# Patient Record
Sex: Male | Born: 1985
Health system: Southern US, Community
[De-identification: ages and names within clinical notes are randomized; demographics above are authoritative.]

## PROBLEM LIST (undated history)

## (undated) DIAGNOSIS — K219 Gastro-esophageal reflux disease without esophagitis: Secondary | ICD-10-CM

## (undated) DIAGNOSIS — R569 Unspecified convulsions: Secondary | ICD-10-CM

## (undated) DIAGNOSIS — K7460 Unspecified cirrhosis of liver: Secondary | ICD-10-CM

## (undated) DIAGNOSIS — F329 Major depressive disorder, single episode, unspecified: Secondary | ICD-10-CM

## (undated) DIAGNOSIS — F909 Attention-deficit hyperactivity disorder, unspecified type: Secondary | ICD-10-CM

## (undated) DIAGNOSIS — S060XAA Concussion with loss of consciousness status unknown, initial encounter: Secondary | ICD-10-CM

## (undated) DIAGNOSIS — F419 Anxiety disorder, unspecified: Secondary | ICD-10-CM

## (undated) DIAGNOSIS — F32A Depression, unspecified: Secondary | ICD-10-CM

## (undated) DIAGNOSIS — S060X9A Concussion with loss of consciousness of unspecified duration, initial encounter: Secondary | ICD-10-CM

## (undated) HISTORY — PX: ESOPHAGOGASTRODUODENOSCOPY: SHX1529

## (undated) HISTORY — DX: Gastro-esophageal reflux disease without esophagitis: K21.9

## (undated) HISTORY — DX: Attention-deficit hyperactivity disorder, unspecified type: F90.9

## (undated) HISTORY — DX: Unspecified cirrhosis of liver: K74.60

## (undated) HISTORY — PX: WISDOM TOOTH EXTRACTION: SHX21

---
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
## 2017-07-01 MED FILL — ONDANSETRON HCL 4 MG TABS: 4 | 10 days supply | Qty: 30 | Fill #0

## 2017-08-06 ENCOUNTER — Emergency Department (HOSPITAL_COMMUNITY): Payer: PRIVATE HEALTH INSURANCE

## 2017-08-06 ENCOUNTER — Encounter (HOSPITAL_COMMUNITY): Payer: Self-pay | Admitting: Emergency Medicine

## 2017-08-06 ENCOUNTER — Ambulatory Visit (HOSPITAL_COMMUNITY)
Admission: RE | Admit: 2017-08-06 | Discharge: 2017-08-06 | Disposition: A | Discharge status: Home / Self Care | Payer: PRIVATE HEALTH INSURANCE | Source: Ambulatory Visit | Attending: Family Medicine | Admitting: Family Medicine

## 2017-08-06 ENCOUNTER — Inpatient Hospital Stay (HOSPITAL_COMMUNITY)
Admission: EM | Admit: 2017-08-06 | Discharge: 2017-08-11 | DRG: 433 | Disposition: A | Discharge status: Home / Self Care | Payer: PRIVATE HEALTH INSURANCE | Attending: Family Medicine | Admitting: Family Medicine

## 2017-08-06 ENCOUNTER — Other Ambulatory Visit: Payer: Self-pay

## 2017-08-06 ENCOUNTER — Ambulatory Visit (INDEPENDENT_AMBULATORY_CARE_PROVIDER_SITE_OTHER): Payer: PRIVATE HEALTH INSURANCE | Admitting: Family Medicine

## 2017-08-06 ENCOUNTER — Telehealth: Payer: Self-pay | Admitting: Family Medicine

## 2017-08-06 ENCOUNTER — Encounter: Payer: Self-pay | Admitting: Family Medicine

## 2017-08-06 VITALS — BP 138/86 | HR 115 | Ht 68.0 in | Wt 211.5 lb

## 2017-08-06 DIAGNOSIS — K219 Gastro-esophageal reflux disease without esophagitis: Secondary | ICD-10-CM | POA: Diagnosis present

## 2017-08-06 DIAGNOSIS — R600 Localized edema: Secondary | ICD-10-CM

## 2017-08-06 DIAGNOSIS — D649 Anemia, unspecified: Secondary | ICD-10-CM | POA: Diagnosis present

## 2017-08-06 DIAGNOSIS — Z8042 Family history of malignant neoplasm of prostate: Secondary | ICD-10-CM

## 2017-08-06 DIAGNOSIS — R112 Nausea with vomiting, unspecified: Secondary | ICD-10-CM

## 2017-08-06 DIAGNOSIS — K760 Fatty (change of) liver, not elsewhere classified: Secondary | ICD-10-CM | POA: Diagnosis present

## 2017-08-06 DIAGNOSIS — K766 Portal hypertension: Secondary | ICD-10-CM | POA: Diagnosis present

## 2017-08-06 DIAGNOSIS — D684 Acquired coagulation factor deficiency: Secondary | ICD-10-CM | POA: Diagnosis present

## 2017-08-06 DIAGNOSIS — R0989 Other specified symptoms and signs involving the circulatory and respiratory systems: Secondary | ICD-10-CM

## 2017-08-06 DIAGNOSIS — E871 Hypo-osmolality and hyponatremia: Secondary | ICD-10-CM | POA: Diagnosis not present

## 2017-08-06 DIAGNOSIS — Z880 Allergy status to penicillin: Secondary | ICD-10-CM

## 2017-08-06 DIAGNOSIS — F410 Panic disorder [episodic paroxysmal anxiety] without agoraphobia: Secondary | ICD-10-CM | POA: Diagnosis present

## 2017-08-06 DIAGNOSIS — F1010 Alcohol abuse, uncomplicated: Secondary | ICD-10-CM | POA: Diagnosis not present

## 2017-08-06 DIAGNOSIS — K7460 Unspecified cirrhosis of liver: Secondary | ICD-10-CM | POA: Insufficient documentation

## 2017-08-06 DIAGNOSIS — K7040 Alcoholic hepatic failure without coma: Secondary | ICD-10-CM | POA: Diagnosis present

## 2017-08-06 DIAGNOSIS — F909 Attention-deficit hyperactivity disorder, unspecified type: Secondary | ICD-10-CM | POA: Insufficient documentation

## 2017-08-06 DIAGNOSIS — R0689 Other abnormalities of breathing: Secondary | ICD-10-CM

## 2017-08-06 DIAGNOSIS — E876 Hypokalemia: Secondary | ICD-10-CM

## 2017-08-06 DIAGNOSIS — R140 Abdominal distension (gaseous): Secondary | ICD-10-CM

## 2017-08-06 DIAGNOSIS — K7290 Hepatic failure, unspecified without coma: Secondary | ICD-10-CM

## 2017-08-06 DIAGNOSIS — F419 Anxiety disorder, unspecified: Secondary | ICD-10-CM

## 2017-08-06 DIAGNOSIS — R188 Other ascites: Secondary | ICD-10-CM

## 2017-08-06 DIAGNOSIS — Z803 Family history of malignant neoplasm of breast: Secondary | ICD-10-CM

## 2017-08-06 DIAGNOSIS — K7689 Other specified diseases of liver: Secondary | ICD-10-CM | POA: Diagnosis present

## 2017-08-06 DIAGNOSIS — K7011 Alcoholic hepatitis with ascites: Secondary | ICD-10-CM

## 2017-08-06 DIAGNOSIS — K7210 Chronic hepatic failure without coma: Secondary | ICD-10-CM

## 2017-08-06 DIAGNOSIS — D689 Coagulation defect, unspecified: Secondary | ICD-10-CM | POA: Diagnosis present

## 2017-08-06 DIAGNOSIS — Z833 Family history of diabetes mellitus: Secondary | ICD-10-CM

## 2017-08-06 DIAGNOSIS — Z8249 Family history of ischemic heart disease and other diseases of the circulatory system: Secondary | ICD-10-CM

## 2017-08-06 DIAGNOSIS — E861 Hypovolemia: Secondary | ICD-10-CM | POA: Diagnosis present

## 2017-08-06 DIAGNOSIS — Z8349 Family history of other endocrine, nutritional and metabolic diseases: Secondary | ICD-10-CM

## 2017-08-06 DIAGNOSIS — E8770 Fluid overload, unspecified: Secondary | ICD-10-CM | POA: Diagnosis present

## 2017-08-06 LAB — AMMONIA: AMMONIA: 78 umol/L — AB (ref 9–35)

## 2017-08-06 LAB — I-STAT CHEM 8, ED
BUN: 6 mg/dL (ref 6–20)
CREATININE: 1.2 mg/dL (ref 0.61–1.24)
Calcium, Ion: 0.97 mmol/L — ABNORMAL LOW (ref 1.15–1.40)
Chloride: 66 mmol/L — ABNORMAL LOW (ref 101–111)
GLUCOSE: 105 mg/dL — AB (ref 65–99)
HCT: 45 % (ref 39.0–52.0)
HEMOGLOBIN: 15.3 g/dL (ref 13.0–17.0)
POTASSIUM: 3.3 mmol/L — AB (ref 3.5–5.1)
Sodium: 109 mmol/L — CL (ref 135–145)
TCO2: 29 mmol/L (ref 22–32)

## 2017-08-06 LAB — ETHANOL

## 2017-08-06 MED ORDER — ONDANSETRON 8 MG PO TBDP
8.0000 mg | ORAL_TABLET | Freq: Three times a day (TID) | ORAL | 1 refills | Status: DC | PRN
Start: 2017-08-06 — End: 2018-09-02

## 2017-08-06 MED ORDER — SODIUM CHLORIDE 0.9 % IV SOLN
Freq: Once | INTRAVENOUS | Status: AC
  Administered 2017-08-06: 22:00:00 via INTRAVENOUS

## 2017-08-06 MED ORDER — ALPRAZOLAM 0.5 MG PO TABS: 0.2500 mg | ORAL_TABLET | ORAL | 0 refills | Status: DC | PRN

## 2017-08-06 MED ORDER — SODIUM CHLORIDE 0.9 % IV BOLUS: 500.0000 mL | Freq: Once | INTRAVENOUS | Status: DC

## 2017-08-06 NOTE — ED Triage Notes (Signed)
Patient presents due to being told by his doctor that his sodium was 113.  AxO at this time

## 2017-08-06 NOTE — Progress Notes (Addendum)
New patient office visit note:  Impression and Recommendations:    1. Non-intractable vomiting with nausea, unspecified vomiting type   2. Excessive drinking of alcohol   3. Bilateral lower extremity edema   4. Non-gaseous abdominal distention     1) Non intractable vomiting with nausea -Start zofran prn - concerns /w acute hepatitis sx/cirrhotic liver dx  2) excessive drinking of alcohol -Do not consume any alcohol, tylenol, or other hepatotoxic substances.  -Start xanax as patient with significant concerns for withdrawal symptoms; pt told to not mix ETOH with xanax and being a physician, is well aware of the risks.   - pt will use PRN only if s-e sx of ETOH w/d become bothersome ans if uncontrollably agitated etc  3) bilateral lower extremity edema - Concerns with hepatic portal hypertension/ venous congestion. -US venous lower bilateral extremity ordered today with venous doppler studies -Elevate your legs above your heart.   4) abdominal distention -Stat labs ordered see below. Will await results.  -Strong concerns for ascites and cirrhotic liver due to alcohol abuse -Ordered STAT abdominal US today. - N.p.o. for today.  Tomorrow start with clear liquid diet.  Multiple small intakes per day.   Drink more liquids, then eventually shift into a BRAT diet.  Reduce intake of fatty foods -Reduce salt intake.  -Further recommendations after CMP and other labs come back.  I have concerns for hyponatremia due to patient's excess alcohol intake.   - patient is significantly third spacing, and declines going to the hospital for IV fluids and fluid electrolyte management at this time.. -After our initial tests we will start you on prilosec 20 mg qd.  -Patient with a EGD in recent past 2-3 years ago but suspect due to psoriatic liver he will need another EGD and eventual workup.  Patient declined GI referral and stated he only wanted to be treated as an outpatient as I have  concerns about esophageal varices and other esophageal problems due to portal hypertension and possible cirrhotic liver   Education and routine counseling performed. Handouts provided.   Orders Placed This Encounter  Procedures  . US Abdomen Complete  . US Venous Img Lower Bilateral  . CBC with Differential/Platelet  . Comprehensive metabolic panel  . Hemoglobin A1c  . Lipid panel  . TSH  . VITAMIN D 25 Hydroxy (Vit-D Deficiency, Fractures)  . T4, free  . Gamma GT  . Direct LDL  . Lipase  . Amylase  . Bilirubin, neonatal (fractionated - tot/dir/indir)  . Ethanol  . INR/PT  . Hepatitis panel, acute    Meds ordered this encounter  Medications  . ALPRAZolam (XANAX) 0.5 MG tablet    Sig: Take 0.5 tablets (0.25 mg total) by mouth as needed (only for panic attacks).    Dispense:  30 tablet    Refill:  0  . ondansetron (ZOFRAN-ODT) 8 MG disintegrating tablet    Sig: Take 1 tablet (8 mg total) by mouth every 8 (eight) hours as needed for nausea.    Dispense:  30 tablet    Refill:  1    Gross side effects, risk and benefits, and alternatives of medications discussed with patient.  Patient is aware that all medications have potential side effects and we are unable to predict every side effect or drug-drug interaction that may occur.  Expresses verbal understanding and consents to current therapy plan and treatment regimen.  Return for Follow-up on stat labs as well as other studies  and soon as he can come in is th this coming Tuesday.-Patient states he needs to go back to work and declines my recommendations to stay home and elevate his legs and take care of himself over the next several days.  Soon as he can return is next Tuesday when he is off.  Appointment made for Tuesday   Please see AVS handed out to patient at the end of our visit for further patient instructions/ counseling done pertaining to today's office visit.    Note: This document was prepared using Dragon voice  recognition software and may include unintentional dictation errors.  This document serves as a record of services personally performed by [name], DO. It was created on her behalf by Thelma Barge, a trained medical scribe. The creation of this record is based on the scribe's personal observations and the provider's statements to them.   I have reviewed the above medical documentation for accuracy and completeness and I concur.  [name] 08/06/17 9:03 PM  ----------------------------------------------------------------------------------------------------------------------    Subjective:    Chief complaint:   Chief Complaint  Patient presents with  . Establish Care    HPI: Vincent [name] is a pleasant 32 y.o. male who presents to [facility] today to review their medical history with me and establish care.   I asked the patient to review their chronic problem list with me to ensure everything was updated and accurate.    All recent office visits with other providers, any medical records that patient brought in etc  - I reviewed today.     We asked pt to get [place] their medical records from [facility] providers/ specialists that they had seen within the past 3-5 years- if they are in private practice and/or do not work for [employer], [facility], [place], [facility] or [employer] owned practice.  Told them to call their specialists to clarify this if they are not sure.   Other providers He has not had a PCP in 1.5 years. He had an EDD in 2011 with results WNL.  He does not have a dermatologist.   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   SHx He drinks alcohol regularly, essentially every day. He mostly drinks beer (about 6 or more beers per day) and only occasionally hard liquor. This has been since college for him. He has picked this habit up more in the last few months due to stress from work. He does not drink in the mornings, but usually  only after work. He also has nausea if he does not drink.   He used to have a 6 pack and very fit/active before medical school.  PMHx He has concerns about gaining weight due to alcohol consumption. He is stressed out about work.   He has a h/o hiatal hernia    GERD- he takes prilosec PRN.  ADHD- he has been off ritalin since October due to not having a PCP.   He has occasional sleep difficulties due to stressors from his job.   FMHx Father prostate CA- age 27, basal, HTN, Mother breast CA- age 35s, basal and melanoma Maternal Uncle, hemorrhagic stroke (passed, age 25), DM2 Paternal Grandfather DM  Acutely He states he has been burping significantly recently since December. But since 3 weeks, he has had excessive bloating in his abdomen and legs. He initially had constipation. He has taken magnesium citrate which alleviated his symptoms, but returned after a few days. He does  not notice any difference in his symptoms after drinking. He states his legs were fine this morning, but in the afternoon after walking all day he has swelling in his legs. His swelling in his legs began about 1 week. He states the last few weeks his belly has been bloated. He has concerns about his swelling across his body.   He has nausea chronically that has worsened over the last few years. Consuming alcohol makes his symptoms better. He is concerned he has ascites and liver cirrhosis. He states last week he was off and drank a little bit more than usual.   He denies vomiting and agitation if he does not drink. He denies CP or palpitations. He denies excess salt consumption. He denies any hepatotoxic substances.    Wt Readings from Last 3 Encounters:  08/06/17 211 lb 8 oz (95.9 kg)   BP Readings from Last 3 Encounters:  08/06/17 138/86   Pulse Readings from Last 3 Encounters:  08/06/17 (!) 115   BMI Readings from Last 3 Encounters:  08/06/17 32.16 kg/m    Patient Care Team    Relationship  Specialty Notifications Start End  [name], DO PCP - General Family Medicine  08/06/17     Patient Active Problem List   Diagnosis Date Noted  . ADHD (attention deficit hyperactivity disorder) 08/06/2017     Past Medical History:  Diagnosis Date  . ADHD      Past Medical History:  Diagnosis Date  . ADHD         Family History  Problem Relation Age of Onset  . Cancer Mother        BREAST  . Heart disease Father   . Hyperlipidemia Father   . Cancer Father        PROSTATE  . Diabetes Maternal Uncle   . Diabetes Paternal Grandfather      Social History   Substance and Sexual Activity  Drug Use Never     Social History   Substance and Sexual Activity  Alcohol Use Yes  . Alcohol/week: 7.2 - 9.0 oz  . Types: 12 - 15 Standard drinks or equivalent per week     Social History   Tobacco Use  Smoking Status Never Smoker  Smokeless Tobacco Never Used     Current Meds  Medication Sig  . Melatonin-Pyridoxine (MELATIN PO) Take by mouth as needed.  Marland Kitchen omeprazole (PRILOSEC) 20 MG capsule Take 20 mg by mouth as needed.    Allergies: Amoxicillin   Review of Systems  Constitutional: Negative for chills, diaphoresis, fever, malaise/fatigue and weight loss.  HENT: Negative for congestion, sore throat and tinnitus.   Eyes: Negative for blurred vision, double vision and photophobia.  Respiratory: Negative for cough and wheezing.   Cardiovascular: Positive for leg swelling. Negative for chest pain, palpitations and orthopnea.  Gastrointestinal: Positive for constipation, heartburn and nausea. Negative for blood in stool and diarrhea.  Genitourinary: Negative for dysuria, flank pain, frequency, hematuria and urgency.  Musculoskeletal: Negative for joint pain and myalgias.  Skin: Negative for itching and rash.  Neurological: Negative for dizziness, tingling, tremors, sensory change, focal weakness, seizures, loss of consciousness, weakness and headaches.    Endo/Heme/Allergies: Negative for environmental allergies and polydipsia. Does not bruise/bleed easily.  Psychiatric/Behavioral: Positive for substance abuse. Negative for depression and memory loss. The patient is nervous/anxious and has insomnia.      Objective:   Blood pressure 138/86, pulse (!) 115, height 5\' 8"  (1.727 m), weight 211 lb  8 oz (95.9 kg). Body mass index is 32.16 kg/m. General: Well Developed, well nourished, and in no acute distress.  Neuro: Alert and oriented x3, extra-ocular muscles intact, sensation grossly intact.  HEENT:[place]/AT, PERRLA, neck supple, No carotid bruits; no JVD appreciated; sclera B/l slightly yellowish tinge Cardiac: Regular rhythm, slightly tacky, hyperdynamic. No m or rub Respiratory: Essentially clear to auscultation bilaterally.  No crackles or bibasilar rales.  Not using accessory muscles, speaking in full sentences.  Abdomen abd distention with fluid shift, tympany present; spider angiomas due to venous congestion present, umbilicus distended but not herniated Musculoskeletal: Ambulates w/o diff, FROM * 4 ext. No liver flap or hyperreflexia present Vasc: less 2 sec cap RF, warm and pink;  Severe pitting edema entire b/l l ext into proximal thighs, taught skin Psych:  No HI/SI, judgement and insight good, Somnolent-appearing.      No results found for this or any previous visit (from the past 2160 hour(s)).

## 2017-08-06 NOTE — Telephone Encounter (Signed)
Just got a call recently from [facility] telling me that patient's serum sodium was 113.    I tried calling patient on his cell phone which he gave me earlier today during office visit at [phone].    Called several times and was not able to reach him.   I will continue to reach out to patient.  Even called his wife on her cell phone as well as texted them both and asked her to have Vincent [name] call me as well as the patient to please call me ASAP.  Will cont to reach out.

## 2017-08-06 NOTE — ED Provider Notes (Addendum)
[facility] Provider Note   CSN: [id number] Arrival date & time: 08/06/17  2129     History   Chief Complaint Chief Complaint  Patient presents with  . Hyponatremia    HPI Hadley PenBrian Mcburney is a 32 y.o. male.  HPI  32 year old male sent from primary care office with reports that he has ascites and hyponatremia.  He complains of abdominal distention for 3 weeks.  He states that he took mag citrate and had a bowel movement but did not have significant relief of his abdominal distention.  He has had decreased appetite.  He has noted increased leg swelling that began today.  Ultrasound obtained by primary care significant for cirrhosis and ascites suggesting portal hypertension with gallbladder wall thickening with negative Murphy's sign suggesting that the gallbladder wall thickening is most likely related to chronic liver disease.  Primary care physician is Dr. Sharee Holsterpalski, she called and states that she was called and told that his sodium was 112.  She informed patient to come to the emergency department.  Patient's wife is here with the patient in the ED.  He reports drinking 4-6 beers per day.  He has a history of GERD and takes Prilosec daily for this.  Past Medical History:  Diagnosis Date  . ADHD     Patient Active Problem List   Diagnosis Date Noted  . ADHD (attention deficit hyperactivity disorder) 08/06/2017    Past Surgical History:  Procedure Laterality Date  . ESOPHAGOGASTRODUODENOSCOPY    . TONSILLECTOMY  1992  . WISDOM TOOTH EXTRACTION          Home Medications    Prior to Admission medications   Medication Sig Start Date End Date Taking? Authorizing Provider  ALPRAZolam Prudy Feeler(XANAX) 0.5 MG tablet Take 0.5 tablets (0.25 mg total) by mouth as needed (only for panic attacks). 08/06/17 02/02/18 Yes Opalski, Gavin Poundeborah, DO  Melatonin 5 MG TABS Take 5 mg by mouth at bedtime as needed (for sleep).   Yes [provider]  omeprazole  (PRILOSEC) 20 MG capsule Take 20 mg by mouth daily as needed (for acid reflux).    Yes [provider]  ondansetron (ZOFRAN-ODT) 8 MG disintegrating tablet Take 1 tablet (8 mg total) by mouth every 8 (eight) hours as needed for nausea. 08/06/17  Yes Opalski, Gavin Poundeborah, DO    Family History Family History  Problem Relation Age of Onset  . Cancer Mother        BREAST  . Heart disease Father   . Hyperlipidemia Father   . Cancer Father        PROSTATE  . Diabetes Maternal Uncle   . Diabetes Paternal Grandfather     Social History Social History   Tobacco Use  . Smoking status: Never Smoker  . Smokeless tobacco: Never Used  Substance Use Topics  . Alcohol use: Yes    Alcohol/week: 7.2 - 9.0 oz    Types: 12 - 15 Standard drinks or equivalent per week  . Drug use: Never     Allergies   Amoxicillin   Review of Systems Review of Systems  Constitutional: Positive for appetite change and fatigue. Negative for activity change, chills, diaphoresis and fever.  HENT: Negative.   Respiratory: Negative.   Cardiovascular: Negative.   Gastrointestinal: Positive for abdominal distention.  Endocrine: Negative.   Genitourinary: Negative.   Musculoskeletal: Negative.   Skin: Negative.   Neurological: Negative.   Hematological: Negative.   Psychiatric/Behavioral: Negative.   All other  systems reviewed and are negative.    Physical Exam Updated Vital Signs There were no vitals taken for this visit.  Physical Exam   ED Treatments / Results  Labs (all labs ordered are listed, but only abnormal results are displayed) Labs Reviewed  COMPREHENSIVE METABOLIC PANEL - Abnormal; Notable for the following components:      Result Value   Sodium 108 (*)    Potassium 3.4 (*)    Chloride 68 (*)    Glucose, Bld 103 (*)    Creatinine, Ser 1.31 (*)    Calcium 8.5 (*)    Total Protein 6.1 (*)    Albumin 2.9 (*)    AST 213 (*)    ALT 72 (*)    Alkaline Phosphatase 184 (*)     Total Bilirubin 5.3 (*)    All other components within normal limits  AMMONIA - Abnormal; Notable for the following components:   Ammonia 78 (*)    All other components within normal limits  I-STAT CHEM 8, ED - Abnormal; Notable for the following components:   Sodium 109 (*)    Potassium 3.3 (*)    Chloride 66 (*)    Glucose, Bld 105 (*)    Calcium, Ion 0.97 (*)    All other components within normal limits  ETHANOL  CBC WITH DIFFERENTIAL/PLATELET  ACETAMINOPHEN LEVEL  PROTIME-INR  HEPATITIS PANEL, ACUTE  URINALYSIS, ROUTINE W REFLEX MICROSCOPIC  SODIUM, URINE, RANDOM  OSMOLALITY, URINE  I-STAT CHEM 8, ED    EKG EKG Interpretation  Date/Time:  Tuesday August 06 2017 21:50:48 EDT Ventricular Rate:  117 PR Interval:    QRS Duration: 94 QT Interval:  360 QTC Calculation: 503 R Axis:   101 Text Interpretation:  Sinus tachycardia Right axis deviation Prolonged QT interval Confirmed by Margarita Grizzle [phone]) on 08/07/2017 12:00:06 AM   Radiology US Abdomen Complete  Result Date: 08/06/2017 CLINICAL DATA:  32 year old male with a history of ascites and vomiting EXAM: ABDOMEN ULTRASOUND COMPLETE COMPARISON:  None. FINDINGS: Gallbladder: Gallbladder wall thickening. Negative sonographic Murphy sign. Minimal degree of dependently layered sludge/microlithiasis without posterior shadowing. Common bile duct: Diameter: 7 mm-8 mm Liver: Nodular contour of the liver with diffusely heterogeneous echotexture. Focal cystic lesion within the right liver measures measures approximately 1.4 cm. Poor sonographic window, limiting evaluation of the main portal vein and right portal vein. The left portal vein is patent with hepatopetal flow. IVC: No abnormality visualized. Pancreas: Visualized portion unremarkable. Spleen: Greatest diameter of the spleen measures 7.8 cm Right Kidney: Length: 9.7 cm. Echogenicity within normal limits. No mass or hydronephrosis visualized. Left Kidney: Length: 10.9 cm.  Echogenicity within normal limits. No mass or hydronephrosis visualized. Abdominal aorta: No aneurysm visualized. Other findings: Ascites IMPRESSION: Cirrhosis and ascites, suggesting portal hypertension. The portal venous system is poorly visualized on the current study, and if there is concern for further evaluation, contrast-enhanced CT or MRI would be suggested. Gallbladder wall thickening most likely related to chronic liver disease. Small amount of biliary sludge/microlithiasis without sonographic evidence of acute cholecystitis. Electronically Signed   By: Gilmer Mor D.O.   On: 08/06/2017 19:01   Dg Chest Port 1 View  Result Date: 08/06/2017 CLINICAL DATA:  Ascites EXAM: PORTABLE CHEST 1 VIEW COMPARISON:  None. FINDINGS: Normal cardiac silhouette. Low lung volumes. No edema, infiltrate or pneumothorax. Band of atelectasis at the LEFT lung base. IMPRESSION: No infiltrate or edema. Electronically Signed   By: Genevive Bi M.D.   On: 08/06/2017 22:05  Procedures Procedures (including critical care time)  Medications Ordered in ED Medications  0.9 %  sodium chloride infusion ( Intravenous New Bag/Given 08/06/17 2210)     Initial Impression / Assessment and Plan / ED Course  I have reviewed the triage vital signs and the nursing notes.  Pertinent labs & imaging results that were available during my care of the patient were reviewed by me and considered in my medical decision making (see chart for details).    32 y.o. Male presents with nausea, vomiting, abdominal distension primary care [place] today with biliary hypertension.  Patient there with sodium 109, other electrolytes normal.  1- severe hyponatremia-chronicity uncertain-  No evidence of primary polydipsia or advance renal failure.  Will need urine to assess Siadh- urine pending.  patient awake and alert without apparent neuro changes, no report of seizure.  Discussed hypertonic saline, but given normal neuro and unknown  chronicity  IV ns infusing. EKG with qtc prolonged 2-Ascites and cirrhosis/liver failure- bili elevated, lfts elevated pending, but given outside [place] report c.w liver failure-  ?etiology etoh., drugs (denies acetaminophen), infection-   Discussed with Dr. Leone Payor, on call for GI, and will see in consult in am Discussed with Dr. Julian Reil and he will see for admission. CRITICAL CARE Performed by: Margarita Grizzle Total critical care time: 45 minutes Critical care time was exclusive of separately billable procedures and treating other patients. Critical care was necessary to treat or prevent imminent or life-threatening deterioration. Critical care was time spent personally by me on the following activities: development of treatment plan with patient and/or surrogate as well as nursing, discussions with consultants, evaluation of patient's response to treatment, examination of patient, obtaining history from patient or surrogate, ordering and performing treatments and interventions, ordering and review of laboratory studies, ordering and review of radiographic studies, pulse oximetry and re-evaluation of patient's condition.  Final Clinical Impressions(s) / ED Diagnoses   Final diagnoses:  Hyponatremia  Other ascites  Liver failure without hepatic coma, unspecified chronicity [facility])    ED Discharge Orders    None       Margarita Grizzle, MD 08/07/17 0001    Margarita Grizzle, MD 08/07/17 0020

## 2017-08-06 NOTE — ED Notes (Signed)
EDP at bedside  

## 2017-08-06 NOTE — Patient Instructions (Signed)
-  No alcohol, Tylenol or other hepatotoxic substances. -N.p.o. rest of today, then push clear fluids-multiple small amounts several times daily for a couple days advancing to brat diet as long as completely asymptomatic.   -Use Zofran as needed for nausea and Xanax as needed -Follow-up with me this coming Tuesday-1 week from today, sooner if concerns or problems. -Despite our desire and willingness to try to manage this as outpatient, if symptoms worsen, please proceed to ER -Stat ultrasound of abdomen and lower extremity today. -Stat blood work will be done

## 2017-08-07 ENCOUNTER — Encounter (HOSPITAL_COMMUNITY): Payer: PRIVATE HEALTH INSURANCE

## 2017-08-07 ENCOUNTER — Inpatient Hospital Stay (HOSPITAL_COMMUNITY): Payer: PRIVATE HEALTH INSURANCE

## 2017-08-07 ENCOUNTER — Encounter (HOSPITAL_COMMUNITY): Payer: Self-pay | Admitting: *Deleted

## 2017-08-07 DIAGNOSIS — K7460 Unspecified cirrhosis of liver: Secondary | ICD-10-CM | POA: Diagnosis not present

## 2017-08-07 DIAGNOSIS — K7011 Alcoholic hepatitis with ascites: Secondary | ICD-10-CM | POA: Diagnosis present

## 2017-08-07 DIAGNOSIS — E861 Hypovolemia: Secondary | ICD-10-CM | POA: Diagnosis present

## 2017-08-07 DIAGNOSIS — R188 Other ascites: Secondary | ICD-10-CM

## 2017-08-07 DIAGNOSIS — D689 Coagulation defect, unspecified: Secondary | ICD-10-CM | POA: Diagnosis not present

## 2017-08-07 DIAGNOSIS — K7200 Acute and subacute hepatic failure without coma: Secondary | ICD-10-CM | POA: Diagnosis not present

## 2017-08-07 DIAGNOSIS — R933 Abnormal findings on diagnostic imaging of other parts of digestive tract: Secondary | ICD-10-CM

## 2017-08-07 DIAGNOSIS — R0989 Other specified symptoms and signs involving the circulatory and respiratory systems: Secondary | ICD-10-CM | POA: Diagnosis not present

## 2017-08-07 DIAGNOSIS — F1010 Alcohol abuse, uncomplicated: Secondary | ICD-10-CM | POA: Diagnosis present

## 2017-08-07 DIAGNOSIS — D649 Anemia, unspecified: Secondary | ICD-10-CM | POA: Diagnosis present

## 2017-08-07 DIAGNOSIS — K760 Fatty (change of) liver, not elsewhere classified: Secondary | ICD-10-CM | POA: Diagnosis present

## 2017-08-07 DIAGNOSIS — Z880 Allergy status to penicillin: Secondary | ICD-10-CM | POA: Diagnosis not present

## 2017-08-07 DIAGNOSIS — E876 Hypokalemia: Secondary | ICD-10-CM | POA: Diagnosis present

## 2017-08-07 DIAGNOSIS — K7040 Alcoholic hepatic failure without coma: Secondary | ICD-10-CM | POA: Diagnosis present

## 2017-08-07 DIAGNOSIS — K219 Gastro-esophageal reflux disease without esophagitis: Secondary | ICD-10-CM | POA: Diagnosis present

## 2017-08-07 DIAGNOSIS — E8770 Fluid overload, unspecified: Secondary | ICD-10-CM | POA: Diagnosis present

## 2017-08-07 DIAGNOSIS — Z8349 Family history of other endocrine, nutritional and metabolic diseases: Secondary | ICD-10-CM | POA: Diagnosis not present

## 2017-08-07 DIAGNOSIS — R17 Unspecified jaundice: Secondary | ICD-10-CM | POA: Diagnosis not present

## 2017-08-07 DIAGNOSIS — Z8042 Family history of malignant neoplasm of prostate: Secondary | ICD-10-CM | POA: Diagnosis not present

## 2017-08-07 DIAGNOSIS — F909 Attention-deficit hyperactivity disorder, unspecified type: Secondary | ICD-10-CM | POA: Diagnosis present

## 2017-08-07 DIAGNOSIS — Z803 Family history of malignant neoplasm of breast: Secondary | ICD-10-CM | POA: Diagnosis not present

## 2017-08-07 DIAGNOSIS — Z833 Family history of diabetes mellitus: Secondary | ICD-10-CM | POA: Diagnosis not present

## 2017-08-07 DIAGNOSIS — Z8249 Family history of ischemic heart disease and other diseases of the circulatory system: Secondary | ICD-10-CM | POA: Diagnosis not present

## 2017-08-07 DIAGNOSIS — K7030 Alcoholic cirrhosis of liver without ascites: Secondary | ICD-10-CM | POA: Diagnosis not present

## 2017-08-07 DIAGNOSIS — K7290 Hepatic failure, unspecified without coma: Secondary | ICD-10-CM | POA: Diagnosis not present

## 2017-08-07 DIAGNOSIS — E871 Hypo-osmolality and hyponatremia: Secondary | ICD-10-CM | POA: Diagnosis present

## 2017-08-07 DIAGNOSIS — K766 Portal hypertension: Secondary | ICD-10-CM | POA: Diagnosis present

## 2017-08-07 DIAGNOSIS — D684 Acquired coagulation factor deficiency: Secondary | ICD-10-CM | POA: Diagnosis present

## 2017-08-07 DIAGNOSIS — F410 Panic disorder [episodic paroxysmal anxiety] without agoraphobia: Secondary | ICD-10-CM | POA: Diagnosis present

## 2017-08-07 DIAGNOSIS — F419 Anxiety disorder, unspecified: Secondary | ICD-10-CM | POA: Diagnosis present

## 2017-08-07 DIAGNOSIS — K7689 Other specified diseases of liver: Secondary | ICD-10-CM | POA: Diagnosis present

## 2017-08-07 LAB — COMPREHENSIVE METABOLIC PANEL
A/G RATIO: 1.1 — AB (ref 1.2–2.2)
ALK PHOS: 184 U/L — AB (ref 38–126)
ALT: 72 IU/L — AB (ref 0–44)
ALT: 72 U/L — AB (ref 17–63)
ALT: 60 U/L (ref 17–63)
ANION GAP: 13 (ref 5–15)
AST: 218 IU/L — AB (ref 0–40)
AST: 213 U/L — ABNORMAL HIGH (ref 15–41)
AST: 177 U/L — ABNORMAL HIGH (ref 15–41)
Albumin: 3.1 g/dL — ABNORMAL LOW (ref 3.5–5.5)
Albumin: 2.9 g/dL — ABNORMAL LOW (ref 3.5–5.0)
Albumin: 2.4 g/dL — ABNORMAL LOW (ref 3.5–5.0)
Alkaline Phosphatase: 202 IU/L — ABNORMAL HIGH (ref 39–117)
Alkaline Phosphatase: 154 U/L — ABNORMAL HIGH (ref 38–126)
Anion gap: 15 (ref 5–15)
BUN/Creatinine Ratio: 6 — ABNORMAL LOW (ref 9–20)
BUN: 7 mg/dL (ref 6–20)
BUN: 6 mg/dL (ref 6–20)
BUN: 7 mg/dL (ref 6–20)
Bilirubin Total: 4.4 mg/dL — ABNORMAL HIGH (ref 0.0–1.2)
CALCIUM: 8.5 mg/dL — AB (ref 8.9–10.3)
CHLORIDE: 69 mmol/L — AB (ref 101–111)
CO2: 27 mmol/L (ref 20–29)
CO2: 25 mmol/L (ref 22–32)
CO2: 26 mmol/L (ref 22–32)
CREATININE: 1.31 mg/dL — AB (ref 0.61–1.24)
Calcium: 9.2 mg/dL (ref 8.7–10.2)
Calcium: 7.9 mg/dL — ABNORMAL LOW (ref 8.9–10.3)
Chloride: 68 mmol/L — CL (ref 96–106)
Chloride: 68 mmol/L — ABNORMAL LOW (ref 101–111)
Creatinine, Ser: 1.18 mg/dL (ref 0.76–1.27)
Creatinine, Ser: 1.3 mg/dL — ABNORMAL HIGH (ref 0.61–1.24)
GFR calc Af Amer: 93 mL/min/{1.73_m2} (ref >59)
GFR calc Af Amer: >60 mL/min (ref >60)
GFR calc non Af Amer: 81 mL/min/{1.73_m2} (ref >59)
GFR calc non Af Amer: >60 mL/min (ref >60)
GLUCOSE: 119 mg/dL — AB (ref 65–99)
Globulin, Total: 2.7 g/dL (ref 1.5–4.5)
Glucose, Bld: 103 mg/dL — ABNORMAL HIGH (ref 65–99)
Glucose, Bld: 90 mg/dL (ref 65–99)
POTASSIUM: 5 mmol/L (ref 3.5–5.2)
Potassium: 3.4 mmol/L — ABNORMAL LOW (ref 3.5–5.1)
Potassium: 3.4 mmol/L — ABNORMAL LOW (ref 3.5–5.1)
SODIUM: 108 mmol/L — AB (ref 135–145)
Sodium: 113 mmol/L — CL (ref 134–144)
Sodium: 108 mmol/L — CL (ref 135–145)
Total Bilirubin: 5.3 mg/dL — ABNORMAL HIGH (ref 0.3–1.2)
Total Bilirubin: 4.8 mg/dL — ABNORMAL HIGH (ref 0.3–1.2)
Total Protein: 5.8 g/dL — ABNORMAL LOW (ref 6.0–8.5)
Total Protein: 6.1 g/dL — ABNORMAL LOW (ref 6.5–8.1)
Total Protein: 5.2 g/dL — ABNORMAL LOW (ref 6.5–8.1)

## 2017-08-07 LAB — CBC WITH DIFFERENTIAL/PLATELET
BASOS ABS: 0 10*3/uL (ref 0.0–0.1)
Basophils Absolute: 0 10*3/uL (ref 0.0–0.2)
Basophils Relative: 0 %
Basos: 0 %
EOS (ABSOLUTE): 0 10*3/uL (ref 0.0–0.4)
EOS PCT: 1 %
Eos: 0 %
Eosinophils Absolute: 0.1 10*3/uL (ref 0.0–0.7)
HCT: 38.7 % — ABNORMAL LOW (ref 39.0–52.0)
Hematocrit: 40.5 % (ref 37.5–51.0)
Hemoglobin: 14.9 g/dL (ref 13.0–17.7)
Hemoglobin: 14.7 g/dL (ref 13.0–17.0)
Immature Grans (Abs): 0 10*3/uL (ref 0.0–0.1)
Immature Granulocytes: 0 %
LYMPHS PCT: 15 %
Lymphocytes Absolute: 1.6 10*3/uL (ref 0.7–3.1)
Lymphs Abs: 2.4 10*3/uL (ref 0.7–4.0)
Lymphs: 10 %
MCH: 36.6 pg — ABNORMAL HIGH (ref 26.6–33.0)
MCH: 36.1 pg — ABNORMAL HIGH (ref 26.0–34.0)
MCHC: 36.8 g/dL — ABNORMAL HIGH (ref 31.5–35.7)
MCHC: 38 g/dL — ABNORMAL HIGH (ref 30.0–36.0)
MCV: 100 fL — ABNORMAL HIGH (ref 79–97)
MCV: 95.1 fL (ref 78.0–100.0)
Monocytes Absolute: 1.8 10*3/uL — ABNORMAL HIGH (ref 0.1–0.9)
Monocytes Absolute: 1.8 10*3/uL — ABNORMAL HIGH (ref 0.1–1.0)
Monocytes Relative: 11 %
Monocytes: 11 %
Neutro Abs: 12.1 10*3/uL — ABNORMAL HIGH (ref 1.7–7.7)
Neutrophils Absolute: 12.7 10*3/uL — ABNORMAL HIGH (ref 1.4–7.0)
Neutrophils Relative %: 73 %
Neutrophils: 79 %
PLATELETS: 363 10*3/uL (ref 150–400)
Platelets: 386 10*3/uL — ABNORMAL HIGH (ref 150–379)
RBC: 4.07 x10E6/uL — ABNORMAL LOW (ref 4.14–5.80)
RBC: 4.07 MIL/uL — AB (ref 4.22–5.81)
RDW: 13.9 % (ref 12.3–15.4)
RDW: 12.5 % (ref 11.5–15.5)
WBC: 16.2 10*3/uL — ABNORMAL HIGH (ref 3.4–10.8)
WBC: 16.4 10*3/uL — AB (ref 4.0–10.5)

## 2017-08-07 LAB — IRON AND TIBC
IRON: 91 ug/dL (ref 45–182)
Saturation Ratios: 88 % — ABNORMAL HIGH (ref 17.9–39.5)
TIBC: 104 ug/dL — AB (ref 250–450)
UIBC: 13 ug/dL

## 2017-08-07 LAB — BASIC METABOLIC PANEL
Anion gap: 8 (ref 5–15)
Anion gap: 10 (ref 5–15)
BUN: 5 mg/dL — AB (ref 6–20)
BUN: 5 mg/dL — AB (ref 6–20)
CHLORIDE: 79 mmol/L — AB (ref 101–111)
CHLORIDE: 82 mmol/L — AB (ref 101–111)
CO2: 26 mmol/L (ref 22–32)
CO2: 24 mmol/L (ref 22–32)
Calcium: 7.6 mg/dL — ABNORMAL LOW (ref 8.9–10.3)
Calcium: 7.6 mg/dL — ABNORMAL LOW (ref 8.9–10.3)
Creatinine, Ser: 1.21 mg/dL (ref 0.61–1.24)
Creatinine, Ser: 1.17 mg/dL (ref 0.61–1.24)
GFR calc Af Amer: >60 mL/min (ref >60)
GFR calc Af Amer: >60 mL/min (ref >60)
GFR calc non Af Amer: >60 mL/min (ref >60)
GFR calc non Af Amer: >60 mL/min (ref >60)
GLUCOSE: 104 mg/dL — AB (ref 65–99)
Glucose, Bld: 111 mg/dL — ABNORMAL HIGH (ref 65–99)
POTASSIUM: 3.1 mmol/L — AB (ref 3.5–5.1)
POTASSIUM: 2.8 mmol/L — AB (ref 3.5–5.1)
SODIUM: 113 mmol/L — AB (ref 135–145)
Sodium: 116 mmol/L — CL (ref 135–145)

## 2017-08-07 LAB — T4, FREE: FREE T4: 1.79 ng/dL — AB (ref 0.82–1.77)

## 2017-08-07 LAB — LIPID PANEL
CHOLESTEROL TOTAL: 195 mg/dL (ref 100–199)
Chol/HDL Ratio: 4.2 ratio (ref 0.0–5.0)
HDL: 46 mg/dL (ref >39)
LDL Calculated: 132 mg/dL — ABNORMAL HIGH (ref 0–99)
Triglycerides: 86 mg/dL (ref 0–149)
VLDL CHOLESTEROL CAL: 17 mg/dL (ref 5–40)

## 2017-08-07 LAB — SODIUM
Sodium: 111 mmol/L — CL (ref 135–145)
Sodium: 112 mmol/L — CL (ref 135–145)
Sodium: 113 mmol/L — CL (ref 135–145)

## 2017-08-07 LAB — HEPATITIS PANEL, ACUTE
HEP A IGM: NEGATIVE
HEP B S AG: NEGATIVE
Hep B C IgM: NEGATIVE
Hep C Virus Ab: <0.1 s/co ratio (ref 0.0–0.9)

## 2017-08-07 LAB — URINALYSIS, ROUTINE W REFLEX MICROSCOPIC
Glucose, UA: NEGATIVE mg/dL
HGB URINE DIPSTICK: NEGATIVE
Ketones, ur: NEGATIVE mg/dL
Leukocytes, UA: NEGATIVE
Nitrite: NEGATIVE
PROTEIN: NEGATIVE mg/dL
Specific Gravity, Urine: 1.012 (ref 1.005–1.030)
pH: 6 (ref 5.0–8.0)

## 2017-08-07 LAB — ETHANOL: Ethanol: NEGATIVE %

## 2017-08-07 LAB — OSMOLALITY, URINE: Osmolality, Ur: 287 mOsm/kg — ABNORMAL LOW (ref 300–900)

## 2017-08-07 LAB — HEMOGLOBIN A1C
ESTIMATED AVERAGE GLUCOSE: 77 mg/dL
Hgb A1c MFr Bld: 4.3 % — ABNORMAL LOW (ref 4.8–5.6)

## 2017-08-07 LAB — SODIUM, URINE, RANDOM

## 2017-08-07 LAB — TSH: TSH: 4.94 u[IU]/mL — ABNORMAL HIGH (ref 0.450–4.500)

## 2017-08-07 LAB — BILIRUBIN, FRACTIONATED(TOT/DIR/INDIR)
BILIRUBIN INDIRECT: 1.84 mg/dL — AB (ref 0.10–0.80)
Bilirubin, Direct: 2.56 mg/dL — ABNORMAL HIGH (ref 0.00–0.40)

## 2017-08-07 LAB — AMYLASE: Amylase: 58 U/L (ref 31–124)

## 2017-08-07 LAB — LDL CHOLESTEROL, DIRECT: LDL DIRECT: 149 mg/dL — AB (ref 0–99)

## 2017-08-07 LAB — PROTIME-INR
INR: 1.7 — ABNORMAL HIGH (ref 0.8–1.2)
INR: 1.54
PROTHROMBIN TIME: 17 s — AB (ref 9.1–12.0)
Prothrombin Time: 18.3 seconds — ABNORMAL HIGH (ref 11.4–15.2)

## 2017-08-07 LAB — ACETAMINOPHEN LEVEL

## 2017-08-07 LAB — VITAMIN D 25 HYDROXY (VIT D DEFICIENCY, FRACTURES): VIT D 25 HYDROXY: 10.3 ng/mL — AB (ref 30.0–100.0)

## 2017-08-07 LAB — FERRITIN: FERRITIN: 1483 ng/mL — AB (ref 24–336)

## 2017-08-07 LAB — LIPASE: LIPASE: 86 U/L — AB (ref 13–78)

## 2017-08-07 LAB — GAMMA GT: GGT: 213 IU/L — ABNORMAL HIGH (ref 0–65)

## 2017-08-07 MED ORDER — VITAMIN B-1 100 MG PO TABS
100.0000 mg | ORAL_TABLET | Freq: Every day | ORAL | Status: DC
  Administered 2017-08-07 – 2017-08-11 (×5): 100 mg via ORAL
  Filled 2017-08-07 (×5): qty 1

## 2017-08-07 MED ORDER — SODIUM CHLORIDE 0.9 % IV BOLUS
500.0000 mL | Freq: Once | INTRAVENOUS | Status: AC
  Administered 2017-08-07: 500 mL via INTRAVENOUS

## 2017-08-07 MED ORDER — LORAZEPAM 1 MG PO TABS
1.0000 mg | ORAL_TABLET | Freq: Four times a day (QID) | ORAL | Status: DC | PRN
  Administered 2017-08-07 – 2017-08-09 (×3): 1 mg via ORAL
  Filled 2017-08-07 (×5): qty 1

## 2017-08-07 MED ORDER — ONDANSETRON HCL 4 MG PO TABS
4.0000 mg | ORAL_TABLET | Freq: Four times a day (QID) | ORAL | Status: DC | PRN
  Administered 2017-08-09: 4 mg via ORAL
  Filled 2017-08-07: qty 1

## 2017-08-07 MED ORDER — ADULT MULTIVITAMIN W/MINERALS CH
1.0000 | ORAL_TABLET | Freq: Every day | ORAL | Status: DC
  Administered 2017-08-07 – 2017-08-11 (×5): 1 via ORAL
  Filled 2017-08-07 (×5): qty 1

## 2017-08-07 MED ORDER — ACETAMINOPHEN 650 MG RE SUPP: 650.0000 mg | Freq: Four times a day (QID) | RECTAL | Status: DC | PRN

## 2017-08-07 MED ORDER — FOLIC ACID 1 MG PO TABS
1.0000 mg | ORAL_TABLET | Freq: Every day | ORAL | Status: DC
  Administered 2017-08-07 – 2017-08-11 (×5): 1 mg via ORAL
  Filled 2017-08-07 (×5): qty 1

## 2017-08-07 MED ORDER — ACETAMINOPHEN 325 MG PO TABS: 650.0000 mg | ORAL_TABLET | Freq: Four times a day (QID) | ORAL | Status: DC | PRN

## 2017-08-07 MED ORDER — LORAZEPAM 2 MG/ML IJ SOLN
1.0000 mg | Freq: Four times a day (QID) | INTRAMUSCULAR | Status: DC | PRN
  Administered 2017-08-08 – 2017-08-10 (×4): 1 mg via INTRAVENOUS
  Filled 2017-08-07 (×4): qty 1

## 2017-08-07 MED ORDER — SODIUM CHLORIDE 3 % IV SOLN
INTRAVENOUS | Status: DC
  Administered 2017-08-07: 30 mL/h via INTRAVENOUS
  Filled 2017-08-07 (×2): qty 500

## 2017-08-07 MED ORDER — ONDANSETRON HCL 4 MG/2ML IJ SOLN: 4.0000 mg | Freq: Four times a day (QID) | INTRAMUSCULAR | Status: DC | PRN

## 2017-08-07 MED ORDER — POTASSIUM CHLORIDE CRYS ER 10 MEQ PO TBCR
30.0000 meq | EXTENDED_RELEASE_TABLET | ORAL | Status: AC
  Administered 2017-08-07 – 2017-08-08 (×2): 30 meq via ORAL
  Filled 2017-08-07 (×2): qty 1

## 2017-08-07 MED ORDER — ONDANSETRON 4 MG PO TBDP: 8.0000 mg | ORAL_TABLET | Freq: Three times a day (TID) | ORAL | Status: DC | PRN

## 2017-08-07 MED ORDER — LIDOCAINE HCL (PF) 2 % IJ SOLN
INTRAMUSCULAR | Status: AC
  Filled 2017-08-07: qty 10

## 2017-08-07 MED ORDER — ALPRAZOLAM 0.25 MG PO TABS
0.2500 mg | ORAL_TABLET | Freq: Every day | ORAL | Status: DC | PRN
  Administered 2017-08-07 (×2): 0.25 mg via ORAL
  Filled 2017-08-07 (×2): qty 1

## 2017-08-07 MED ORDER — PANTOPRAZOLE SODIUM 40 MG PO TBEC
40.0000 mg | DELAYED_RELEASE_TABLET | Freq: Every day | ORAL | Status: DC
  Administered 2017-08-07 – 2017-08-11 (×5): 40 mg via ORAL
  Filled 2017-08-07 (×5): qty 1

## 2017-08-07 NOTE — ED Notes (Signed)
Patient denies pain and is resting comfortably.  

## 2017-08-07 NOTE — H&P (Addendum)
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  He went to his PCPs office today with 3 week history of abdominal distention, increased leg swelling that began today.  Decreased appetite.  Went to PCPs office.  [place] of abdomen shows cirrhosis, ascites, and suggests portal hypertension.  Lab work at PCPs office showed sodium of 112 at which point PCP called patient and sent him in to the ED.   ED Course: Sodium 108, Cl 68.  BUN 6, creat 1.3.  WBC 16.4.  AST 213, ALT 72, T bili 5.3, ammonia 78, albumin 2.9.  Alk phos 184, INR 1.5.   Review of Systems: As per HPI otherwise 10 point review of systems negative.   Past Medical History:  Diagnosis Date  . ADHD     Past Surgical History:  Procedure Laterality Date  . ESOPHAGOGASTRODUODENOSCOPY    . TONSILLECTOMY  1992  . WISDOM TOOTH EXTRACTION       reports that he has never smoked. He has never used smokeless tobacco. He reports that he drinks about 7.2 - 9.0 oz of alcohol per week. He reports that he does not use drugs.  Allergies  Allergen Reactions  . Amoxicillin Rash    Family History  Problem Relation Age of Onset  . Cancer Mother        BREAST  . Heart disease Father   . Hyperlipidemia Father   . Cancer Father        PROSTATE  . Diabetes Maternal Uncle   . Diabetes Paternal Grandfather      Prior to Admission medications   Medication Sig Start Date End Date Taking? Authorizing Provider  ALPRAZolam Duanne Moron) 0.5 MG tablet Take 0.5 tablets (0.25 mg total) by mouth as needed (only for panic attacks). 08/06/17 02/02/18 Yes Opalski, Neoma Laming, DO  Melatonin 5 MG TABS Take 5 mg by mouth at bedtime as needed (for sleep).   Yes [provider]  omeprazole (PRILOSEC) 20 MG capsule Take 20 mg by mouth daily as needed (for acid reflux).    Yes [provider]  ondansetron (ZOFRAN-ODT) 8 MG disintegrating tablet Take 1 tablet (8 mg total) by mouth every 8 (eight) hours as needed for nausea. 08/06/17  Yes Mellody Dance, DO    Physical Exam: Vitals:   08/07/17 0015 08/07/17 0030 08/07/17 0045 08/07/17 0115  BP: 123/82 123/73 121/83 122/75  Pulse: (!) 112 (!) 107 (!) 114 (!) 108  Resp: 20 20 (!) 21 (!) 23  SpO2: 97% 98% 97% 98%    Constitutional: NAD, calm, comfortable Eyes: PERRL, lids and conjunctivae normal ENMT: Mucous membranes are moist. Posterior pharynx clear of any exudate or lesions.Normal dentition.  Neck: normal, supple, no masses, no thyromegaly Respiratory: clear to auscultation bilaterally, no wheezing, no crackles. Normal respiratory effort. No accessory muscle use.  Cardiovascular: Regular rate and rhythm, no murmurs / rubs / gallops. No extremity edema. 2+ pedal pulses. No carotid bruits.  Abdomen: Distended, no tenderness, no rebound Musculoskeletal: 1+ BLE edema. Skin: no rashes, lesions, ulcers. No induration Neurologic: CN 2-12 grossly intact. Sensation intact, DTR normal. Strength 5/5 in all 4.  Psychiatric: Normal judgment and insight. Alert and oriented x 3. Normal mood.    Labs  on Admission: I have personally reviewed following labs and imaging studies  CBC: Recent Labs  Lab 08/06/17 2208 08/06/17 2311  WBC 16.4*  --   NEUTROABS 12.1*  --   HGB 14.7 15.3  HCT 38.7* 45.0  MCV 95.1  --   PLT 363  --    Basic Metabolic Panel: Recent Labs  Lab 08/06/17 2208 08/06/17 2311  NA 108* 109*  K 3.4* 3.3*  CL 68* 66*  CO2 25  --   GLUCOSE 103* 105*  BUN 6 6  CREATININE 1.31* 1.20  CALCIUM 8.5*  --    GFR: Estimated Creatinine Clearance: 98.3 mL/min (by C-G formula based on SCr of 1.2 mg/dL). Liver Function Tests: Recent Labs  Lab 08/06/17 2208  AST 213*  ALT 72*   ALKPHOS 184*  BILITOT 5.3*  PROT 6.1*  ALBUMIN 2.9*   No results for input(s): LIPASE, AMYLASE in the last 168 hours. Recent Labs  Lab 08/06/17 2208  AMMONIA 78*   Coagulation Profile: Recent Labs  Lab 08/07/17 0047  INR 1.54   Cardiac Enzymes: No results for input(s): CKTOTAL, CKMB, CKMBINDEX, TROPONINI in the last 168 hours. BNP (last 3 results) No results for input(s): PROBNP in the last 8760 hours. HbA1C: No results for input(s): HGBA1C in the last 72 hours. CBG: No results for input(s): GLUCAP in the last 168 hours. Lipid Profile: No results for input(s): CHOL, HDL, LDLCALC, TRIG, CHOLHDL, LDLDIRECT in the last 72 hours. Thyroid Function Tests: No results for input(s): TSH, T4TOTAL, FREET4, T3FREE, THYROIDAB in the last 72 hours. Anemia Panel: No results for input(s): VITAMINB12, FOLATE, FERRITIN, TIBC, IRON, RETICCTPCT in the last 72 hours. Urine analysis: No results found for: COLORURINE, APPEARANCEUR, LABSPEC, PHURINE, GLUCOSEU, HGBUR, BILIRUBINUR, KETONESUR, PROTEINUR, UROBILINOGEN, NITRITE, LEUKOCYTESUR  Radiological Exams on Admission: US Abdomen Complete  Result Date: 08/06/2017 CLINICAL DATA:  32 year old male with a history of ascites and vomiting EXAM: ABDOMEN ULTRASOUND COMPLETE COMPARISON:  None. FINDINGS: Gallbladder: Gallbladder wall thickening. Negative sonographic Murphy sign. Minimal degree of dependently layered sludge/microlithiasis without posterior shadowing. Common bile duct: Diameter: 7 mm-8 mm Liver: Nodular contour of the liver with diffusely heterogeneous echotexture. Focal cystic lesion within the right liver measures measures approximately 1.4 cm. Poor sonographic window, limiting evaluation of the main portal vein and right portal vein. The left portal vein is patent with hepatopetal flow. IVC: No abnormality visualized. Pancreas: Visualized portion unremarkable. Spleen: Greatest diameter of the spleen measures 7.8 cm Right Kidney: Length: 9.7  cm. Echogenicity within normal limits. No mass or hydronephrosis visualized. Left Kidney: Length: 10.9 cm. Echogenicity within normal limits. No mass or hydronephrosis visualized. Abdominal aorta: No aneurysm visualized. Other findings: Ascites IMPRESSION: Cirrhosis and ascites, suggesting portal hypertension. The portal venous system is poorly visualized on the current study, and if there is concern for further evaluation, contrast-enhanced CT or MRI would be suggested. Gallbladder wall thickening most likely related to chronic liver disease. Small amount of biliary sludge/microlithiasis without sonographic evidence of acute cholecystitis. Electronically Signed   By: Corrie Mckusick D.O.   On: 08/06/2017 19:01   Dg Chest Port 1 View  Result Date: 08/06/2017 CLINICAL DATA:  Ascites EXAM: PORTABLE CHEST 1 VIEW COMPARISON:  None. FINDINGS: Normal cardiac silhouette. Low lung volumes. No edema, infiltrate or pneumothorax. Band of atelectasis at the LEFT lung base. IMPRESSION: No infiltrate or edema. Electronically Signed   By: Suzy Bouchard M.D.   On: 08/06/2017 22:05    EKG: Independently reviewed.  Assessment/Plan Principal Problem:  Cirrhosis of liver with ascites (HCC) Active Problems:   Hyponatremia    1. Apparent liver failure / cirrhosis, with ascites - 1. Dr. Carlean Purl to see in AM 2. Repeat CMP in AM 3. Unclear cause at this point 4. Hepatitis pnl pending 5. But if hyponatremia is baseline / chronic as I fear then MELD score is 28. 2. Hyponatremia - 1. IVF: NS at 125 for now 1. Patient states he feels dry. 2. Will see if this helps, though it could just end up making the ascites worse as I mentioned to patient. 2. Urine sodium < 10 and low urine osm: SIADH is very unlikely 3. Suspect chronic hyponatremia given completely normal mental status despite sodium 108.  DVT prophylaxis: SCDs only for now due to INR 1.5 Code Status: Full Family Communication: Wife at bedside Disposition  Plan: TBD Consults called: GI to see in AM, EDP spoke with Dr. Carlean Purl Admission status: Admit to inpatient   [place], [place] [facility] Pager [phone]  If 7AM-7PM, please contact day team taking care of patient [email or web address] Password [email or web address]  08/07/2017, 1:50 AM

## 2017-08-07 NOTE — ED Notes (Signed)
So abdominal distention noted

## 2017-08-07 NOTE — ED Notes (Signed)
Provider called and made aware of critical lab, order place to change hypertonic solution rate from 3630mL/hr to 2720mL/hr.

## 2017-08-07 NOTE — ED Notes (Signed)
MD in talking with patient at this time.

## 2017-08-07 NOTE — Consult Note (Signed)
[facility]  HISTORY AND PHYSICAL  Vincent Vincent [name] an 32 y.o. male.    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  Pt initially went to PCP for a several day/week history of abd distention and LE edema.  Abd [place] perfomed which noted cirrhosis and ascites.    Labs from PCP as follows: Na of 113, Cr of 1.18, Tbili 4.4, AST 218, ALT 72, Albumin 3.1, INR 1.7, plts 386.  He was given NS @ 125 mL/ hr overnight.  Na this AM Vincent [name] now 108.  Urine Na < 10, urine osms 287, serum osms not done. We are asked to see.    Pt noted he had an EGD in 2011 due to chronic GERD/ nausea which he states was unremarkable.    He denies f/c, n/v, SOB, confusion/ mental slowing.  Abd distention a little uncomfortable.  Consumes EtOH, around 4-6 beverages nightly.  No FH of liver or kidney disease.  No FH of lung disease.      PMH: Past Medical History:  Diagnosis Date  . ADHD    PSH: Past Surgical History:  Procedure Laterality Date  . ESOPHAGOGASTRODUODENOSCOPY    . TONSILLECTOMY  1992  . WISDOM TOOTH EXTRACTION       Past Medical History:  Diagnosis Date  . ADHD     Medications:   Scheduled: . folic acid  1 mg Oral Daily  . lidocaine      . multivitamin with minerals  1 tablet Oral Daily  . pantoprazole  40 mg Oral Daily  . thiamine  100 mg Oral Daily     (Not in a hospital admission)  ALLERGIES:   Allergies  Allergen Reactions  . Amoxicillin Rash    FAM HX: Family History  Problem Relation Age of Onset  . Cancer Mother        BREAST  . Heart disease Father   . Hyperlipidemia Father   . Cancer Father        PROSTATE  . Diabetes Maternal Uncle   . Diabetes Paternal Grandfather     Social History:   reports that he has never smoked. He has never used smokeless tobacco. He reports that he  drinks about 7.2 - 9.0 oz of alcohol per week. He reports that he does not use drugs.  ROS: ROS: all other systems reviewed and are negative except as per HPI  Blood pressure 123/79, pulse (!) 103, resp. rate 18, SpO2 97 %. PHYSICAL EXAM: Physical Exam  GEN NAD, lying in bed HEENT EOMI, PERRL, slightly jaundiced with scleral icterus NECK + prominent JVP PULM clear bilaterally normal WOB no c/w/r CV slightly tachy, no m/r/g ABD + distention with some fluid wave, cannot feel liver edge EXT 1+ LE edema NEURO AAO x 3, appears to take a little long to answer questions but Vincent [name].  No asterixis SKIN warm and dry, no rashes or ecchymoses    Results for orders placed or performed during the hospital encounter of 08/06/17 (from the past 48 hour(s))  CBC with Differential/Platelet     Status: Abnormal   Collection Time: 08/06/17 10:08 PM  Result Value Ref Range   WBC 16.4 (H) 4.0 - 10.5 K/uL   RBC 4.07 (L) 4.22 - 5.81 MIL/uL   Hemoglobin 14.7 13.0 -  17.0 g/dL   HCT 38.7 (L) 39.0 - 52.0 %   MCV 95.1 78.0 - 100.0 fL   MCH 36.1 (H) 26.0 - 34.0 pg   MCHC 38.0 (H) 30.0 - 36.0 g/dL    Comment: RULED OUT INTERFERING SUBSTANCES   RDW 12.5 11.5 - 15.5 %   Platelets 363 150 - 400 K/uL   Neutrophils Relative % 73 %   Neutro Abs 12.1 (H) 1.7 - 7.7 K/uL   Lymphocytes Relative 15 %   Lymphs Abs 2.4 0.7 - 4.0 K/uL   Monocytes Relative 11 %   Monocytes Absolute 1.8 (H) 0.1 - 1.0 K/uL   Eosinophils Relative 1 %   Eosinophils Absolute 0.1 0.0 - 0.7 K/uL   Basophils Relative 0 %   Basophils Absolute 0.0 0.0 - 0.1 K/uL    Comment: Performed at [facility], [address] [place], [place] [address]  Comprehensive metabolic panel     Status: Abnormal   Collection Time: 08/06/17 10:08 PM  Result Value Ref Range   Sodium 108 (LL) 135 - 145 mmol/L    Comment: REPEATED TO VERIFY CRITICAL RESULT CALLED TO, READ BACK BY AND VERIFIED WITH: PHILLIPS T,RN 08/07/17 0004 WAYK    Potassium  3.4 (L) 3.5 - 5.1 mmol/L   Chloride 68 (L) 101 - 111 mmol/L   CO2 25 22 - 32 mmol/L   Glucose, Bld 103 (H) 65 - 99 mg/dL   BUN 6 6 - 20 mg/dL   Creatinine, Ser 1.31 (H) 0.61 - 1.24 mg/dL   Calcium 8.5 (L) 8.9 - 10.3 mg/dL   Total Protein 6.1 (L) 6.5 - 8.1 g/dL   Albumin 2.9 (L) 3.5 - 5.0 g/dL   AST 213 (H) 15 - 41 U/L   ALT 72 (H) 17 - 63 U/L   Alkaline Phosphatase 184 (H) 38 - 126 U/L   Total Bilirubin 5.3 (H) 0.3 - 1.2 mg/dL   GFR calc non Af Amer >60 >60 mL/min   GFR calc Af Amer >60 >60 mL/min    Comment: (NOTE) The eGFR has been calculated using the CKD EPI equation. This calculation has not been validated in all clinical situations. eGFR's persistently <60 mL/min signify possible Chronic Kidney Disease.    Anion gap 15 5 - 15    Comment: Performed at [facility] [address] [place], [place] [address]  Ethanol     Status: None   Collection Time: 08/06/17 10:08 PM  Result Value Ref Range   Alcohol, Ethyl (B) <10 <10 mg/dL    Comment:        LOWEST DETECTABLE LIMIT FOR SERUM ALCOHOL Vincent [name] 10 mg/dL FOR MEDICAL PURPOSES ONLY Performed at [facility], [place] [address] [place],  [address]   Ammonia     Status: Abnormal   Collection Time: 08/06/17 10:08 PM  Result Value Ref Range   Ammonia 78 (H) 9 - 35 umol/L    Comment: Performed at [facility], [place] [address], [place], [place] [address]  Acetaminophen level     Status: Abnormal   Collection Time: 08/06/17 10:08 PM  Result Value Ref Range   Acetaminophen (Tylenol), Serum <10 (L) 10 - 30 ug/mL    Comment:        THERAPEUTIC CONCENTRATIONS VARY SIGNIFICANTLY. A RANGE OF 10-30 ug/mL MAY BE AN EFFECTIVE CONCENTRATION FOR MANY PATIENTS. HOWEVER, SOME ARE BEST TREATED AT CONCENTRATIONS OUTSIDE THIS RANGE. ACETAMINOPHEN CONCENTRATIONS >150 ug/mL AT 4 HOURS AFTER  INGESTION AND >50 ug/mL AT 12 HOURS AFTER INGESTION ARE OFTEN ASSOCIATED WITH TOXIC REACTIONS. Performed at [place], [place] [address], [place], [place] [address]   I-Stat Chem 8, ED     Status: Abnormal   Collection Time: 08/06/17 11:11 PM  Result Value Ref Range   Sodium 109 (LL) 135 - 145 mmol/L   Potassium 3.3 (L) 3.5 - 5.1 mmol/L   Chloride 66 (L) 101 - 111 mmol/L   BUN 6 6 - 20 mg/dL   Creatinine, Ser 1.20 0.61 - 1.24 mg/dL   Glucose, Bld 105 (H) 65 - 99 mg/dL   Calcium, Ion 0.97 (L) 1.15 - 1.40 mmol/L   TCO2 29 22 - 32 mmol/L   Hemoglobin 15.3 13.0 - 17.0 g/dL   HCT 45.0 39.0 - 52.0 %   Comment NOTIFIED PHYSICIAN   Protime-INR     Status: Abnormal   Collection Time: 08/07/17 12:47 AM  Result Value Ref Range   Prothrombin Time 18.3 (H) 11.4 - 15.2 seconds   INR 1.54     Comment: Performed at [facility], [place] [address] [place], [place] [address]  Urinalysis, Routine w reflex microscopic     Status: Abnormal   Collection Time: 08/07/17  1:33 AM  Result Value Ref Range   Color, Urine AMBER (A) YELLOW    Comment: BIOCHEMICALS MAY BE AFFECTED BY COLOR   APPearance CLEAR CLEAR   Specific Gravity, Urine 1.012 1.005 - 1.030   pH 6.0 5.0 - 8.0   Glucose, UA NEGATIVE NEGATIVE mg/dL   Hgb urine dipstick NEGATIVE NEGATIVE   Bilirubin Urine SMALL (A) NEGATIVE   Ketones, ur NEGATIVE NEGATIVE mg/dL   Protein, ur NEGATIVE NEGATIVE mg/dL   Nitrite NEGATIVE NEGATIVE   Leukocytes, UA NEGATIVE NEGATIVE    Comment: Performed at [facility] [address], [place], [place] [address]  Sodium, urine, random     Status: None   Collection Time: 08/07/17  1:33 AM  Result Value Ref Range   Sodium, Ur <10 mmol/L    Comment: Performed at [facility] [address], [place], [place] [address]  Osmolality, urine     Status: Abnormal   Collection Time: 08/07/17  1:33 AM  Result Value Ref Range   Osmolality, Ur 287 (L) 300 - 900 mOsm/kg    Comment: Performed at [facility] [address] [place],  [address]  Comprehensive metabolic panel     Status: Abnormal   Collection  Time: 08/07/17  3:17 AM  Result Value Ref Range   Sodium 108 (LL) 135 - 145 mmol/L    Comment: CRITICAL RESULT CALLED TO, READ BACK BY AND VERIFIED WITH: JASPER A,RN 08/07/17 0412 WAYK    Potassium 3.4 (L) 3.5 - 5.1 mmol/L   Chloride 69 (L) 101 - 111 mmol/L   CO2 26 22 - 32 mmol/L   Glucose, Bld 90 65 - 99 mg/dL   BUN 7 6 - 20 mg/dL   Creatinine, Ser 1.30 (H) 0.61 - 1.24 mg/dL   Calcium 7.9 (L) 8.9 - 10.3 mg/dL   Total Protein 5.2 (L) 6.5 - 8.1 g/dL   Albumin 2.4 (L) 3.5 - 5.0 g/dL   AST 177 (H) 15 - 41 U/L   ALT 60 17 - 63 U/L   Alkaline Phosphatase 154 (H) 38 - 126 U/L   Total Bilirubin 4.8 (H) 0.3 - 1.2 mg/dL   GFR calc non Af Amer >60 >60  mL/min   GFR calc Af Amer >60 >60 mL/min    Comment: (NOTE) The eGFR has been calculated using the CKD EPI equation. This calculation has not been validated in all clinical situations. eGFR's persistently <60 mL/min signify possible Chronic Kidney Disease.    Anion gap 13 5 - 15    Comment: Performed at [facility] [address] [place], [place] [address]  Ferritin     Status: Abnormal   Collection Time: 08/07/17 10:09 AM  Result Value Ref Range   Ferritin 1,483 (H) 24 - 336 ng/mL    Comment: Performed at [facility] [address], [place], [place] [address]  Iron and TIBC     Status: Abnormal   Collection Time: 08/07/17 10:09 AM  Result Value Ref Range   Iron 91 45 - 182 ug/dL   TIBC 104 (L) 250 - 450 ug/dL   Saturation Ratios 88 (H) 17.9 - 39.5 %   UIBC 13 ug/dL    Comment: Performed at [facility], [place] [address] [place], [place] [address]  Sodium     Status: Abnormal   Collection Time: 08/07/17 10:09 AM  Result Value Ref Range   Sodium 111 (LL) 135 - 145 mmol/L    Comment: CRITICAL RESULT CALLED TO, READ BACK BY AND VERIFIED WITH: Performed at [facility] [address] [place], [place] [address]     US Abdomen Complete  Result Date: 08/06/2017 CLINICAL DATA:  32 year old male with a  history of ascites and vomiting EXAM: ABDOMEN ULTRASOUND COMPLETE COMPARISON:  None. FINDINGS: Gallbladder: Gallbladder wall thickening. Negative sonographic Murphy sign. Minimal degree of dependently layered sludge/microlithiasis without posterior shadowing. Common bile duct: Diameter: 7 mm-8 mm Liver: Nodular contour of the liver with diffusely heterogeneous echotexture. Focal cystic lesion within the right liver measures measures approximately 1.4 cm. Poor sonographic window, limiting evaluation of the main portal vein and right portal vein. The left portal vein Vincent [name] patent with hepatopetal flow. IVC: No abnormality visualized. Pancreas: Visualized portion unremarkable. Spleen: Greatest diameter of the spleen measures 7.8 cm Right Kidney: Length: 9.7 cm. Echogenicity within normal limits. No mass or hydronephrosis visualized. Left Kidney: Length: 10.9 cm. Echogenicity within normal limits. No mass or hydronephrosis visualized. Abdominal aorta: No aneurysm visualized. Other findings: Ascites IMPRESSION: Cirrhosis and ascites, suggesting portal hypertension. The portal venous system Vincent [name] poorly visualized on the current study, and if there Vincent [name] concern for further evaluation, contrast-enhanced CT or MRI would be suggested. Gallbladder wall thickening most likely related to chronic liver disease. Small amount of biliary sludge/microlithiasis without sonographic evidence of acute cholecystitis. Electronically Signed   By: Corrie Mckusick D.O.   On: 08/06/2017 19:01   Dg Chest Port 1 View  Result Date: 08/06/2017 CLINICAL DATA:  Ascites EXAM: PORTABLE CHEST 1 VIEW COMPARISON:  None. FINDINGS: Normal cardiac silhouette. Low lung volumes. No edema, infiltrate or pneumothorax. Band of atelectasis at the LEFT lung base. IMPRESSION: No infiltrate or edema. Electronically Signed   By: Suzy Bouchard M.D.   On: 08/06/2017 22:05    Assessment/Plan  1.  Severe hypervolemic hyponatremia: Pt appears to have hypervolemic  hyponatremia based on clinical history and exam that Vincent [name] now exacerbated by the administration of isotonic fluids.  Fluids have been stopped.  I'm not sure the chronicity of his hyponatremia as there are no other labs to compare to.  However, he will require 3% Na infusion for initiation of correction.  Will start at 69  mL/ hr, q 2 Na checks, move to higher level of care.  Would aim to correct no more than 6-8 in 24 hrs simply because unclear chronicity.  I am anticipating a water diuresis with the administration of hypertonic fluids so would hold off on diuretics or tolvaptan at this time.  Once Na in a more reasonable range and he no longer requires 3%, we can add the diuretics.  2.  New cirrhosis with likely portal HTN and ascites: GI consulted and workup Vincent [name] ongoing.  Checking iron, A1AT, ceruloplasmin, ANA, Anti-smooth muscle, and AFP.   Madelon Lips 08/07/2017, 11:55 AM

## 2017-08-07 NOTE — ED Notes (Signed)
Meal tray at bedside.  

## 2017-08-07 NOTE — Progress Notes (Signed)
On call Nephrology  Noted sodium increase  113 -- 116   Will slow the rate of 3% saline to 10cc/hour   Noted low K and order to replete

## 2017-08-07 NOTE — Progress Notes (Signed)
Attempted to bring pt down for paracentesis. Per pts nurse, order on hold due to low sodium. Per RN, pts MD wants sodium corrected before paracentesis.

## 2017-08-07 NOTE — Progress Notes (Addendum)
PROGRESS NOTE  Vincent [name] GNF:[id number] DOB: 1985/08/04 DOA: 08/06/2017 PCP: [name], DO  HPI/Recap of past 91 hours: 32 year old with past medical history of ADHD and frequent beer drinking presented to his PCP on 4/16 with complaints of several weeks of lower extremity swelling and abdominal distention and ultrasound of the abdomen noted signs of cirrhosis, ascites and portal hypertension.  Lab work done noted a sodium of 112 for which the patient was then directed to the emergency room.  Patient himself fully alert and oriented.  In the emergency room, labs are similar also noting some transaminitis with AST of 213, white count 16.4, bilirubin of 5.3 and ammonia level of 78.  Patient's INR was 5.  Admitted to the hospitalist service and initially started on low-dose normal saline.  GI consulted.  GI saw this patient this morning and certainly concerning for acute liver disease.  There is a suspicion of some underlying process along with alcoholic hepatitis.  Nephrology consulted and patient started on hypertonic saline, warranting transfer to ICU.  Patient himself doing okay.  Denies any complaints including abdominal pain, dizziness, confusion.  He tells me his last beer was Monday night approximately 36 hours prior.  Assessment/Plan: Principal Problem:   Cirrhosis of liver with ascites (HCC): Workup has been initiated.  Blood work sent.  Will get large volume paracentesis once sodium more stabilized. Active Problems: Severe hyponatremia in the setting of hypervolemia: Appreciate nephrology and gastroenterology assistance.  No previous labs for comparison.  Patient transferred to ICU and started on low-dose hypertonic solution.  Agree that patient should start water diuresing once given hypotonic fluids.  No reason for diuretics at this time.  Alcohol abuse: No heavy aggressive drinking patterns, but patient does have a consistent alcohol consumption use.  Monitor for alcohol  withdrawal.  By tomorrow night, he will be past the 72-hour mark.  Coagulopathy: Secondary to liver dysfunction   Code Status: Full code  Family Communication: Declined for me to update his family, he says he is keeping his wife updated  Disposition Plan: Continue in ICU until sodium stabilized   Consultants:  Gastroenterology  Nephrology  Procedures:  Paracentesis to be done once sodium stabilized  Antimicrobials:   None  DVT prophylaxis: SCDs   Objective: Vitals:   08/07/17 1130 08/07/17 1201 08/07/17 1300 08/07/17 1330  BP: 123/79 119/76 124/82 118/78  Pulse: (!) 103 (!) 104 99 (!) 104  Resp:      SpO2: 97% 96% 95% 95%    Intake/Output Summary (Last 24 hours) at 08/07/2017 1416 Last data filed at 08/07/2017 0811 Gross per 24 hour  Intake 1251.25 ml  Output -  Net 1251.25 ml   There were no vitals filed for this visit. There is no height or weight on file to calculate BMI.  Exam:   General: Alert and oriented x3, no acute distress  HEENT: Normocephalic and atraumatic mucous membranes slightly dry, sclera mildly icteric  Neck: Supple, no JVD  Cardiovascular: Regular rate and rhythm, S1-S2  Respiratory: Clear to auscultation bilaterally  Abdomen: Soft, distended, nontender, hypoactive bowel sounds  Musculoskeletal: No clubbing or cyanosis, trace pitting edema  Skin: No skin breaks, tears or lesions  Neuro: No focal deficits  Psychiatry: Appropriate, no evidence of psychoses   Data Reviewed: CBC: Recent Labs  Lab 08/06/17 1624 08/06/17 2208 08/06/17 2311  WBC 16.2* 16.4*  --   NEUTROABS 12.7* 12.1*  --   HGB 14.9 14.7 15.3  HCT 40.5 38.7* 45.0  MCV  100* 95.1  --   PLT 386* 363  --    Basic Metabolic Panel: Recent Labs  Lab 08/06/17 1624 08/06/17 2208 08/06/17 2311 08/07/17 0317 08/07/17 1009 08/07/17 1229  NA 113* 108* 109* 108* 111* 112*  K 5.0 3.4* 3.3* 3.4*  --   --   CL 68* 68* 66* 69*  --   --   CO2 27 25  --  26  --    --   GLUCOSE 119* 103* 105* 90  --   --   BUN 7 6 6 7   --   --   CREATININE 1.18 1.31* 1.20 1.30*  --   --   CALCIUM 9.2 8.5*  --  7.9*  --   --    GFR: Estimated Creatinine Clearance: 90.8 mL/min (A) (by C-G formula based on SCr of 1.3 mg/dL (H)). Liver Function Tests: Recent Labs  Lab 08/06/17 1624 08/06/17 2208 08/07/17 0317  AST 218* 213* 177*  ALT 72* 72* 60  ALKPHOS 202* 184* 154*  BILITOT 4.4* 5.3* 4.8*  PROT 5.8* 6.1* 5.2*  ALBUMIN 3.1* 2.9* 2.4*   Recent Labs  Lab 08/06/17 1624  LIPASE 86*  AMYLASE 58   Recent Labs  Lab 08/06/17 2208  AMMONIA 78*   Coagulation Profile: Recent Labs  Lab 08/06/17 1624 08/07/17 0047  INR 1.7* 1.54   Cardiac Enzymes: No results for input(s): CKTOTAL, CKMB, CKMBINDEX, TROPONINI in the last 168 hours. BNP (last 3 results) No results for input(s): PROBNP in the last 8760 hours. HbA1C: Recent Labs    08/06/17 1624  HGBA1C 4.3*   CBG: No results for input(s): GLUCAP in the last 168 hours. Lipid Profile: Recent Labs    08/06/17 1624  CHOL 195  HDL 46  LDLCALC 132*  TRIG 86  CHOLHDL 4.2  LDLDIRECT 149*   Thyroid Function Tests: Recent Labs    08/06/17 1624  TSH 4.940*  FREET4 1.79*   Anemia Panel: Recent Labs    08/07/17 1009  FERRITIN 1,483*  TIBC 104*  IRON 91   Urine analysis:    Component Value Date/Time   COLORURINE AMBER (A) 08/07/2017 0133   APPEARANCEUR CLEAR 08/07/2017 0133   LABSPEC 1.012 08/07/2017 0133   PHURINE 6.0 08/07/2017 0133   GLUCOSEU NEGATIVE 08/07/2017 0133   HGBUR NEGATIVE 08/07/2017 0133   BILIRUBINUR SMALL (A) 08/07/2017 0133   KETONESUR NEGATIVE 08/07/2017 0133   PROTEINUR NEGATIVE 08/07/2017 0133   NITRITE NEGATIVE 08/07/2017 0133   LEUKOCYTESUR NEGATIVE 08/07/2017 0133   Sepsis Labs: @LABRCNTIP (procalcitonin:4,lacticidven:4)  )No results found for this or any previous visit (from the past 240 hour(s)).    Studies: [place] Abdomen Complete  Result Date:  08/06/2017 CLINICAL DATA:  32 year old male with a history of ascites and vomiting EXAM: ABDOMEN ULTRASOUND COMPLETE COMPARISON:  None. FINDINGS: Gallbladder: Gallbladder wall thickening. Negative sonographic Murphy sign. Minimal degree of dependently layered sludge/microlithiasis without posterior shadowing. Common bile duct: Diameter: 7 mm-8 mm Liver: Nodular contour of the liver with diffusely heterogeneous echotexture. Focal cystic lesion within the right liver measures measures approximately 1.4 cm. Poor sonographic window, limiting evaluation of the main portal vein and right portal vein. The left portal vein is patent with hepatopetal flow. IVC: No abnormality visualized. Pancreas: Visualized portion unremarkable. Spleen: Greatest diameter of the spleen measures 7.8 cm Right Kidney: Length: 9.7 cm. Echogenicity within normal limits. No mass or hydronephrosis visualized. Left Kidney: Length: 10.9 cm. Echogenicity within normal limits. No mass or hydronephrosis visualized. Abdominal aorta: No  aneurysm visualized. Other findings: Ascites IMPRESSION: Cirrhosis and ascites, suggesting portal hypertension. The portal venous system is poorly visualized on the current study, and if there is concern for further evaluation, contrast-enhanced CT or MRI would be suggested. Gallbladder wall thickening most likely related to chronic liver disease. Small amount of biliary sludge/microlithiasis without sonographic evidence of acute cholecystitis. Electronically Signed   By: Gilmer Mor D.O.   On: 08/06/2017 19:01   Dg Chest Port 1 View  Result Date: 08/06/2017 CLINICAL DATA:  Ascites EXAM: PORTABLE CHEST 1 VIEW COMPARISON:  None. FINDINGS: Normal cardiac silhouette. Low lung volumes. No edema, infiltrate or pneumothorax. Band of atelectasis at the LEFT lung base. IMPRESSION: No infiltrate or edema. Electronically Signed   By: Genevive Bi M.D.   On: 08/06/2017 22:05    Scheduled Meds: . folic acid  1 mg Oral  Daily  . lidocaine      . multivitamin with minerals  1 tablet Oral Daily  . pantoprazole  40 mg Oral Daily  . thiamine  100 mg Oral Daily    Continuous Infusions: . sodium chloride (hypertonic) 30 mL/hr (08/07/17 1112)     LOS: 0 days   I have spent approximately 40 of critical care time with this severely ill patient including face-to-face evaluation, plan of care with consultants and ordering and interpretation of lab work   Hollice Espy, MD [facility]  To reach me or the doctor on call, go to: [email or web address] Password [email or web address]  08/07/2017, 2:16 PM

## 2017-08-07 NOTE — ED Notes (Signed)
Pt states that he is "feeling jumpy"; pt requesting xanax

## 2017-08-07 NOTE — Consult Note (Addendum)
Consultation  Referring Provider:     Lyda Perone Primary Care Physician:  [name], DO Primary Gastroenterologist:        Gentry Fitz Reason for Consultation:     Ascites, suspected cirrhosis         HPI:   Vincent [name] is a 32 y.o. male with suspected cirrhosis, ascites, GI consulted to assist in management.  He reports he was in his usual state of health, about 3 weeks ago, started feeling poorly with abdominal distension and lower extremity edema. He's also had nausea / vomiting and poor appetite. He went to his PCP's office and noted to have Na of 108 and abnormal LFTs, INR of 1.5. Sent to the ED, US shows ascites with suspected cirrhosis and a cystic lesion of the liver, poorly characterized.  He denies any history of liver disease or family history of liver disease. He has been drinking roughly 4-6 alcoholic beverages per night lately, he thinks recently this has been more frequent. He thinks for the past 1-2 years he has drank at this volume, but has some days where he does not drink at all. No prior history of jaundice. History of ADHD and GERD, otherwise healthy. 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 Denies any mental status changes. He denies much abdominal pain.  He was given IVF overnight by primary service. He states he feels about the same.   Past Medical History:  Diagnosis Date  . ADHD     Past Surgical History:  Procedure Laterality Date  . ESOPHAGOGASTRODUODENOSCOPY    . TONSILLECTOMY  1992  . WISDOM TOOTH EXTRACTION      Family History  Problem Relation Age of Onset  . Cancer Mother        BREAST  . Heart disease Father   . Hyperlipidemia Father   . Cancer Father        PROSTATE  . Diabetes Maternal Uncle   . Diabetes Paternal Grandfather      Social History   Tobacco Use  . Smoking status: Never Smoker  . Smokeless tobacco: Never Used  Substance Use Topics  . Alcohol use: Yes    Alcohol/week: 7.2  - 9.0 oz    Types: 12 - 15 Standard drinks or equivalent per week  . Drug use: Never    Prior to Admission medications   Medication Sig Start Date End Date Taking? Authorizing Provider  ALPRAZolam Prudy Feeler) 0.5 MG tablet Take 0.5 tablets (0.25 mg total) by mouth as needed (only for panic attacks). 08/06/17 02/02/18 Yes Opalski, Gavin Pound, DO  Melatonin 5 MG TABS Take 5 mg by mouth at bedtime as needed (for sleep).   Yes [provider]  omeprazole (PRILOSEC) 20 MG capsule Take 20 mg by mouth daily as needed (for acid reflux).    Yes [provider]  ondansetron (ZOFRAN-ODT) 8 MG disintegrating tablet Take 1 tablet (8 mg total) by mouth every 8 (eight) hours as needed for nausea. 08/06/17  Yes Opalski, Deborah, DO    Current Facility-Administered Medications  Medication Dose Route Frequency Provider Last Rate Last Dose  . ALPRAZolam Prudy Feeler) tablet 0.25 mg  0.25 mg Oral Daily PRN Hillary Bow, DO   0.25 mg at 08/07/17 0429  . folic acid (FOLVITE) tablet 1 mg  1 mg Oral Daily Julian Reil, Jared M, DO      . multivitamin with minerals tablet 1 tablet  1 tablet Oral Daily Lyda Perone  M, DO      . ondansetron (ZOFRAN) tablet 4 mg  4 mg Oral Q6H PRN Hillary BowGardner, Jared M, DO       Or  . ondansetron [facility](ZOFRAN) injection 4 mg  4 mg Intravenous Q6H PRN Hillary BowGardner, Jared M, DO      . ondansetron (ZOFRAN-ODT) disintegrating tablet 8 mg  8 mg Oral Q8H PRN Hillary BowGardner, Jared M, DO      . pantoprazole (PROTONIX) EC tablet 40 mg  40 mg Oral Daily Julian ReilGardner, Jared M, DO      . thiamine (VITAMIN B-1) tablet 100 mg  100 mg Oral Daily Hillary BowGardner, Jared M, DO       Current Outpatient Medications  Medication Sig Dispense Refill  . ALPRAZolam (XANAX) 0.5 MG tablet Take 0.5 tablets (0.25 mg total) by mouth as needed (only for panic attacks). 30 tablet 0  . Melatonin 5 MG TABS Take 5 mg by mouth at bedtime as needed (for sleep).    Marland Kitchen. omeprazole (PRILOSEC) 20 MG capsule Take 20 mg by mouth daily as needed (for acid  reflux).     . ondansetron (ZOFRAN-ODT) 8 MG disintegrating tablet Take 1 tablet (8 mg total) by mouth every 8 (eight) hours as needed for nausea. 30 tablet 1    Allergies as of 08/06/2017 - Review Complete 08/06/2017  Allergen Reaction Noted  . Amoxicillin Rash 08/06/2017     Review of Systems:    As per HPI, otherwise negative    Physical Exam:  Vital signs in last 24 hours: Pulse Rate:  [95-117] 95 (04/17 0809) Resp:  [16-23] 18 (04/17 0809) BP: (106-137)/(55-90) 108/55 (04/17 0809) SpO2:  [96 %-100 %] 96 % (04/17 0809)   General:   Pleasant male in NAD Head:  Normocephalic and atraumatic. Eyes:   (+) icterus.   Conjunctiva pink. Ears:  Normal auditory acuity. Neck:  Supple Lungs:  Respirations even and unlabored. No wheezes, crackles, or rhonchi.  Heart:  Regular rate and rhythm; no MRG Abdomen:  Soft, distended, nontender.. No appreciable masses  Rectal:  Not performed.  Msk:  Symmetrical without gross deformities.  Extremities:  (+) 2 LE edema. Neurologic:  Alert and  oriented x4;  grossly normal neurologically. No asterixis Skin:  Intact without significant lesions or rashes. Psych:  Alert and cooperative. Normal affect.  LAB RESULTS: Recent Labs    08/06/17 1624 08/06/17 2208 08/06/17 2311  WBC 16.2* 16.4*  --   HGB 14.9 14.7 15.3  HCT 40.5 38.7* 45.0  PLT 386* 363  --    BMET Recent Labs    08/06/17 1624 08/06/17 2208 08/06/17 2311 08/07/17 0317  NA 113* 108* 109* 108*  K 5.0 3.4* 3.3* 3.4*  CL 68* 68* 66* 69*  CO2 27 25  --  26  GLUCOSE 119* 103* 105* 90  BUN 7 6 6 7   CREATININE 1.18 1.31* 1.20 1.30*  CALCIUM 9.2 8.5*  --  7.9*   LFT Recent Labs    08/06/17 1624  08/07/17 0317  PROT 5.8*   < > 5.2*  ALBUMIN 3.1*   < > 2.4*  AST 218*   < > 177*  ALT 72*   < > 60  ALKPHOS 202*   < > 154*  BILITOT 4.4*   < > 4.8*  BILIDIR 2.56*  --   --   IBILI 1.84*  --   --    < > = values in this interval not displayed.   PT/INR Recent Labs  08/06/17 1624 08/07/17 0047  LABPROT 17.0* 18.3*  INR 1.7* 1.54    STUDIES: US Abdomen Complete  Result Date: 08/06/2017 CLINICAL DATA:  32 year old male with a history of ascites and vomiting EXAM: ABDOMEN ULTRASOUND COMPLETE COMPARISON:  None. FINDINGS: Gallbladder: Gallbladder wall thickening. Negative sonographic Murphy sign. Minimal degree of dependently layered sludge/microlithiasis without posterior shadowing. Common bile duct: Diameter: 7 mm-8 mm Liver: Nodular contour of the liver with diffusely heterogeneous echotexture. Focal cystic lesion within the right liver measures measures approximately 1.4 cm. Poor sonographic window, limiting evaluation of the main portal vein and right portal vein. The left portal vein is patent with hepatopetal flow. IVC: No abnormality visualized. Pancreas: Visualized portion unremarkable. Spleen: Greatest diameter of the spleen measures 7.8 cm Right Kidney: Length: 9.7 cm. Echogenicity within normal limits. No mass or hydronephrosis visualized. Left Kidney: Length: 10.9 cm. Echogenicity within normal limits. No mass or hydronephrosis visualized. Abdominal aorta: No aneurysm visualized. Other findings: Ascites IMPRESSION: Cirrhosis and ascites, suggesting portal hypertension. The portal venous system is poorly visualized on the current study, and if there is concern for further evaluation, contrast-enhanced CT or MRI would be suggested. Gallbladder wall thickening most likely related to chronic liver disease. Small amount of biliary sludge/microlithiasis without sonographic evidence of acute cholecystitis. Electronically Signed   By: Gilmer Mor D.O.   On: 08/06/2017 19:01   Dg Chest Port 1 View  Result Date: 08/06/2017 CLINICAL DATA:  Ascites EXAM: PORTABLE CHEST 1 VIEW COMPARISON:  None. FINDINGS: Normal cardiac silhouette. Low lung volumes. No edema, infiltrate or pneumothorax. Band of atelectasis at the LEFT lung base. IMPRESSION: No infiltrate or edema.  Electronically Signed   By: Genevive Bi M.D.   On: 08/06/2017 22:05        Impression / Plan:   32 y/o male with history of alcohol use, presenting with worsening ascites / LE edema, jaundice, and severe hyponatremia. US shows changes concerning for cirrhosis with a small cystic lesion of the liver.   We discussed constellation of findings on imaging and labs. He is young to have cirrhosis, he warrants full serologic workup to assess for etiologies of chronic liver disease, but given his alcohol use this is the possible etiology, and I suspect he has a component of alcoholic hepatitis currently. He is volume overloaded, I stopped his IVF, he will third space this and make the problem worse. He ultimately warrants diuretics, low NA diet, but given severity of hyponatremia discussed management with nephrology, Dr. Signe Colt. I have asked them to consult on his case, he warrants transfer to ICU for severe hyponatremia, in need of hypertonic saline.  Recommend the following: - stop IVF - low Na diet - stopped IVF. Discussed hyponatremia with nephrology. He warrants transfer to ICU for hypertonic saline given Na < 110. Will hold on diuretics until this is addressed. - large volume paracentesis with cell count, gram stain, culture, cytology, total protein, albumin - labs to evaluate for chronic liver diseases, I will order these - MRI of the liver with contrast (will do this after paracentesis, in the next day or so) - alcohol abstinence, should be on CIWA scale for withdrawal possibility - will need EGD for varices screening down the road  Call with questions, will follow closely.  Ileene Patrick, MD [facility]

## 2017-08-08 ENCOUNTER — Inpatient Hospital Stay (HOSPITAL_COMMUNITY): Payer: PRIVATE HEALTH INSURANCE

## 2017-08-08 ENCOUNTER — Encounter (HOSPITAL_COMMUNITY): Payer: Self-pay | Admitting: Student

## 2017-08-08 DIAGNOSIS — D689 Coagulation defect, unspecified: Secondary | ICD-10-CM | POA: Diagnosis present

## 2017-08-08 HISTORY — PX: IR PARACENTESIS: IMG2679

## 2017-08-08 LAB — BASIC METABOLIC PANEL
ANION GAP: 8 (ref 5–15)
ANION GAP: 10 (ref 5–15)
ANION GAP: 10 (ref 5–15)
ANION GAP: 10 (ref 5–15)
ANION GAP: 9 (ref 5–15)
Anion gap: 10 (ref 5–15)
Anion gap: 8 (ref 5–15)
Anion gap: 8 (ref 5–15)
BUN: 7 mg/dL (ref 6–20)
BUN: 5 mg/dL — ABNORMAL LOW (ref 6–20)
BUN: 6 mg/dL (ref 6–20)
BUN: 6 mg/dL (ref 6–20)
BUN: 7 mg/dL (ref 6–20)
BUN: 7 mg/dL (ref 6–20)
BUN: 5 mg/dL — ABNORMAL LOW (ref 6–20)
BUN: 6 mg/dL (ref 6–20)
CALCIUM: 8.2 mg/dL — AB (ref 8.9–10.3)
CALCIUM: 7.8 mg/dL — AB (ref 8.9–10.3)
CALCIUM: 8 mg/dL — AB (ref 8.9–10.3)
CALCIUM: 8 mg/dL — AB (ref 8.9–10.3)
CALCIUM: 7.7 mg/dL — AB (ref 8.9–10.3)
CALCIUM: 7.8 mg/dL — AB (ref 8.9–10.3)
CALCIUM: 7.7 mg/dL — AB (ref 8.9–10.3)
CO2: 27 mmol/L (ref 22–32)
CO2: 26 mmol/L (ref 22–32)
CO2: 26 mmol/L (ref 22–32)
CO2: 27 mmol/L (ref 22–32)
CO2: 26 mmol/L (ref 22–32)
CO2: 26 mmol/L (ref 22–32)
CO2: 26 mmol/L (ref 22–32)
CO2: 26 mmol/L (ref 22–32)
Calcium: 7.9 mg/dL — ABNORMAL LOW (ref 8.9–10.3)
Chloride: 79 mmol/L — ABNORMAL LOW (ref 101–111)
Chloride: 83 mmol/L — ABNORMAL LOW (ref 101–111)
Chloride: 85 mmol/L — ABNORMAL LOW (ref 101–111)
Chloride: 84 mmol/L — ABNORMAL LOW (ref 101–111)
Chloride: 84 mmol/L — ABNORMAL LOW (ref 101–111)
Chloride: 87 mmol/L — ABNORMAL LOW (ref 101–111)
Chloride: 87 mmol/L — ABNORMAL LOW (ref 101–111)
Chloride: 87 mmol/L — ABNORMAL LOW (ref 101–111)
Creatinine, Ser: 1.27 mg/dL — ABNORMAL HIGH (ref 0.61–1.24)
Creatinine, Ser: 1.16 mg/dL (ref 0.61–1.24)
Creatinine, Ser: 1.18 mg/dL (ref 0.61–1.24)
Creatinine, Ser: 1.13 mg/dL (ref 0.61–1.24)
Creatinine, Ser: 1.24 mg/dL (ref 0.61–1.24)
Creatinine, Ser: 1.12 mg/dL (ref 0.61–1.24)
Creatinine, Ser: 1.14 mg/dL (ref 0.61–1.24)
Creatinine, Ser: 1.08 mg/dL (ref 0.61–1.24)
GFR calc Af Amer: >60 mL/min (ref >60)
GFR calc Af Amer: >60 mL/min (ref >60)
GFR calc Af Amer: >60 mL/min (ref >60)
GFR calc Af Amer: >60 mL/min (ref >60)
GFR calc Af Amer: >60 mL/min (ref >60)
GFR calc Af Amer: >60 mL/min (ref >60)
GFR calc non Af Amer: >60 mL/min (ref >60)
GLUCOSE: 101 mg/dL — AB (ref 65–99)
GLUCOSE: 97 mg/dL (ref 65–99)
GLUCOSE: 97 mg/dL (ref 65–99)
GLUCOSE: 116 mg/dL — AB (ref 65–99)
GLUCOSE: 118 mg/dL — AB (ref 65–99)
GLUCOSE: 109 mg/dL — AB (ref 65–99)
GLUCOSE: 126 mg/dL — AB (ref 65–99)
Glucose, Bld: 104 mg/dL — ABNORMAL HIGH (ref 65–99)
POTASSIUM: 3.5 mmol/L (ref 3.5–5.1)
POTASSIUM: 3.2 mmol/L — AB (ref 3.5–5.1)
Potassium: 3.2 mmol/L — ABNORMAL LOW (ref 3.5–5.1)
Potassium: 3.3 mmol/L — ABNORMAL LOW (ref 3.5–5.1)
Potassium: 3.7 mmol/L (ref 3.5–5.1)
Potassium: 3.6 mmol/L (ref 3.5–5.1)
Potassium: 3.3 mmol/L — ABNORMAL LOW (ref 3.5–5.1)
Potassium: 3.2 mmol/L — ABNORMAL LOW (ref 3.5–5.1)
SODIUM: 121 mmol/L — AB (ref 135–145)
SODIUM: 123 mmol/L — AB (ref 135–145)
SODIUM: 123 mmol/L — AB (ref 135–145)
SODIUM: 122 mmol/L — AB (ref 135–145)
Sodium: 116 mmol/L — CL (ref 135–145)
Sodium: 117 mmol/L — CL (ref 135–145)
Sodium: 119 mmol/L — CL (ref 135–145)
Sodium: 118 mmol/L — CL (ref 135–145)

## 2017-08-08 LAB — ALBUMIN, PLEURAL OR PERITONEAL FLUID: Albumin, Fluid: <1 g/dL

## 2017-08-08 LAB — HIV ANTIBODY (ROUTINE TESTING W REFLEX): HIV Screen 4th Generation wRfx: NONREACTIVE

## 2017-08-08 LAB — PROTEIN, PLEURAL OR PERITONEAL FLUID: Total protein, fluid: <3 g/dL

## 2017-08-08 LAB — HEPATITIS PANEL, ACUTE
HCV Ab: <0.1 s/co ratio (ref 0.0–0.9)
HEP A IGM: NEGATIVE
HEP B C IGM: NEGATIVE
Hepatitis B Surface Ag: NEGATIVE

## 2017-08-08 LAB — ANTI-SMOOTH MUSCLE ANTIBODY, IGG: F-ACTIN AB IGG: 4 U (ref 0–19)

## 2017-08-08 LAB — GRAM STAIN

## 2017-08-08 LAB — BODY FLUID CELL COUNT WITH DIFFERENTIAL
EOS FL: 0 %
Lymphs, Fluid: 17 %
MONOCYTE-MACROPHAGE-SEROUS FLUID: 70 % (ref 50–90)
Neutrophil Count, Fluid: 13 % (ref 0–25)
Total Nucleated Cell Count, Fluid: 50 cu mm (ref 0–1000)

## 2017-08-08 LAB — HEPATIC FUNCTION PANEL
ALT: 50 U/L (ref 17–63)
AST: 140 U/L — AB (ref 15–41)
Albumin: 2.3 g/dL — ABNORMAL LOW (ref 3.5–5.0)
Alkaline Phosphatase: 138 U/L — ABNORMAL HIGH (ref 38–126)
BILIRUBIN DIRECT: 1.2 mg/dL — AB (ref 0.1–0.5)
Indirect Bilirubin: 1.5 mg/dL — ABNORMAL HIGH (ref 0.3–0.9)
TOTAL PROTEIN: 5 g/dL — AB (ref 6.5–8.1)
Total Bilirubin: 2.7 mg/dL — ABNORMAL HIGH (ref 0.3–1.2)

## 2017-08-08 LAB — SODIUM: Sodium: 122 mmol/L — ABNORMAL LOW (ref 135–145)

## 2017-08-08 LAB — LACTATE DEHYDROGENASE, PLEURAL OR PERITONEAL FLUID: LD FL: 34 U/L — AB (ref 3–23)

## 2017-08-08 LAB — ANA W/REFLEX IF POSITIVE: ANA: NEGATIVE

## 2017-08-08 LAB — CERULOPLASMIN: CERULOPLASMIN: 18.5 mg/dL (ref 16.0–31.0)

## 2017-08-08 LAB — ALPHA-1-ANTITRYPSIN: A1 ANTITRYPSIN SER: 164 mg/dL (ref 90–200)

## 2017-08-08 LAB — IGG: IgG (Immunoglobin G), Serum: 928 mg/dL (ref 700–1600)

## 2017-08-08 LAB — AFP TUMOR MARKER: AFP, Serum, Tumor Marker: 2.8 ng/mL (ref 0.0–8.3)

## 2017-08-08 LAB — MRSA PCR SCREENING: MRSA BY PCR: NEGATIVE

## 2017-08-08 MED ORDER — LIDOCAINE HCL (PF) 1 % IJ SOLN
INTRAMUSCULAR | Status: AC
  Filled 2017-08-08: qty 30

## 2017-08-08 MED ORDER — LIDOCAINE HCL (PF) 1 % IJ SOLN
INTRAMUSCULAR | Status: DC | PRN
  Administered 2017-08-08: 10 mL

## 2017-08-08 MED ORDER — SIMETHICONE 80 MG PO CHEW
80.0000 mg | CHEWABLE_TABLET | Freq: Four times a day (QID) | ORAL | Status: DC | PRN
  Administered 2017-08-08 (×2): 80 mg via ORAL
  Filled 2017-08-08 (×2): qty 1

## 2017-08-08 NOTE — Progress Notes (Signed)
PROGRESS NOTE  Vincent [name] ZOX:[id number] DOB: [birth year] DOA: 08/06/2017 PCP: [name], DO  HPI/Recap of past 18 hours: 32 year old with past medical history of ADHD and frequent beer drinking presented to his PCP on 4/16 with complaints of several weeks of lower extremity swelling and abdominal distention and ultrasound of the abdomen noted signs of cirrhosis, ascites and portal hypertension.  Lab work done noted a sodium of 112 for which the patient was then directed to the emergency room.  Patient himself fully alert and oriented.  In the emergency room, labs are similar also noting some transaminitis with AST of 213, white count 16.4, bilirubin of 5.3 and ammonia level of 78.  Patient's INR was 5.  Admitted to the hospitalist service and initially started on low-dose normal saline.  GI consulted.  GI consulted and evaluated patient for possible acute hepatitis/cirrhosis, possibly secondary to acute process in the setting of alcohol consumption.  Nephrology consulted and patient started on hypertonic saline after follow-up sodium down to 108, warranting transfer to ICU.  Patient responded well and over the past 24 hours, his sodium has slowly increased to 118.  Hypertonic saline stopped this morning by nephrology.  Transferring out of ICU.  Patient himself with no complaints, no changes in mentation, no abdominal pain.  Seen by interventional radiology and 5 L of ascites removed (fluid studies pending).   Assessment/Plan: Principal Problem:   Cirrhosis of liver with ascites [facility]): Status post paracentesis of 5 L.  Follow-up studies and ascitic fluid evaluation pending.  Questionable underlying process in the setting of alcohol use and abuse.     Severe hyponatremia in the setting of hypervolemia: Appreciate nephrology and gastroenterology assistance.  No previous labs for comparison.  With sodium now more stabilized, hypertonic solution stopped.  Follow numbers and once leveled off,  we will initiate diuretics.  Nephrology following.    Alcohol abuse: No heavy aggressive drinking patterns, but patient does have a consistent alcohol consumption use.  Monitor for alcohol withdrawal.  Some mild tachycardia.  Patient will be out of 72-hour mark by tonight.  Coagulopathy: Secondary to liver dysfunction   Code Status: Full code  Family Communication: Declined for me to update his family, he says he is keeping his wife updated  Disposition Plan: Continue inpatient until sodium stabilized.  If need be, further workup can be done as outpatient with GI   Consultants:  Gastroenterology  Nephrology  Procedures:  Paracentesis done 4/18: 5 L of fluid removed, studies pending  Antimicrobials:   None  DVT prophylaxis: SCDs   Objective: Vitals:   08/08/17 0700 08/08/17 0734 08/08/17 0800 08/08/17 1300  BP: 108/74  110/75 122/79  Pulse: 90  96   Resp: 14  16 18   Temp:  98.5 F (36.9 C)    TempSrc:  Oral    SpO2: 94%  95%     Intake/Output Summary (Last 24 hours) at 08/08/2017 1420 Last data filed at 08/08/2017 1300 Gross per 24 hour  Intake 598.58 ml  Output 1175 ml  Net -576.42 ml   There were no vitals filed for this visit. There is no height or weight on file to calculate BMI.  Exam:   General: Alert and oriented x3, no acute distress  HEENT: Normocephalic and atraumatic mucous membranes slightly dry, sclera mildly icteric  Neck: Supple, no JVD  Cardiovascular: Regular rhythm, borderline tachycardia, S1-S2  Respiratory: Clear to auscultation bilaterally  Abdomen: Soft, distended (seen before paracentesis), nontender, hypoactive bowel sounds  Musculoskeletal:  No clubbing or cyanosis, 1-2+ pitting edema from the knees down bilaterally  Skin: No skin breaks, tears or lesions  Neuro: No focal deficits  Psychiatry: Appropriate, no evidence of psychoses   Data Reviewed: CBC: Recent Labs  Lab 08/06/17 1624 08/06/17 2208 08/06/17 2311    WBC 16.2* 16.4*  --   NEUTROABS 12.7* 12.1*  --   HGB 14.9 14.7 15.3  HCT 40.5 38.7* 45.0  MCV 100* 95.1  --   PLT 386* 363  --    Basic Metabolic Panel: Recent Labs  Lab 08/08/17 0027 08/08/17 0244 08/08/17 0513 08/08/17 0707 08/08/17 1024  NA 116* 117* 119* 118* 121*  K 3.2* 3.3* 3.7 3.6 3.5  CL 79* 83* 85* 84* 84*  CO2 27 26 26 26 27   GLUCOSE 104* 101* 97 97 116*  BUN 7 6 7 7  5*  CREATININE 1.27* 1.16 1.18 1.13 1.24  CALCIUM 7.8* 7.7* 7.9* 7.7* 8.2*   GFR: Estimated Creatinine Clearance: 95.2 mL/min (by C-G formula based on SCr of 1.24 mg/dL). Liver Function Tests: Recent Labs  Lab 08/06/17 1624 08/06/17 2208 08/07/17 0317  AST 218* 213* 177*  ALT 72* 72* 60  ALKPHOS 202* 184* 154*  BILITOT 4.4* 5.3* 4.8*  PROT 5.8* 6.1* 5.2*  ALBUMIN 3.1* 2.9* 2.4*   Recent Labs  Lab 08/06/17 1624  LIPASE 86*  AMYLASE 58   Recent Labs  Lab 08/06/17 2208  AMMONIA 78*   Coagulation Profile: Recent Labs  Lab 08/06/17 1624 08/07/17 0047  INR 1.7* 1.54   Cardiac Enzymes: No results for input(s): CKTOTAL, CKMB, CKMBINDEX, TROPONINI in the last 168 hours. BNP (last 3 results) No results for input(s): PROBNP in the last 8760 hours. HbA1C: Recent Labs    08/06/17 1624  HGBA1C 4.3*   CBG: No results for input(s): GLUCAP in the last 168 hours. Lipid Profile: Recent Labs    08/06/17 1624  CHOL 195  HDL 46  LDLCALC 132*  TRIG 86  CHOLHDL 4.2  LDLDIRECT 149*   Thyroid Function Tests: Recent Labs    08/06/17 1624  TSH 4.940*  FREET4 1.79*   Anemia Panel: Recent Labs    08/07/17 1009  FERRITIN 1,483*  TIBC 104*  IRON 91   Urine analysis:    Component Value Date/Time   COLORURINE AMBER (A) 08/07/2017 0133   APPEARANCEUR CLEAR 08/07/2017 0133   LABSPEC 1.012 08/07/2017 0133   PHURINE 6.0 08/07/2017 0133   GLUCOSEU NEGATIVE 08/07/2017 0133   HGBUR NEGATIVE 08/07/2017 0133   BILIRUBINUR SMALL (A) 08/07/2017 0133   KETONESUR NEGATIVE 08/07/2017  0133   PROTEINUR NEGATIVE 08/07/2017 0133   NITRITE NEGATIVE 08/07/2017 0133   LEUKOCYTESUR NEGATIVE 08/07/2017 0133   Sepsis Labs: @LABRCNTIP (procalcitonin:4,lacticidven:4)  ) Recent Results (from the past 240 hour(s))  MRSA PCR Screening     Status: None   Collection Time: 08/07/17 11:34 PM  Result Value Ref Range Status   MRSA by PCR NEGATIVE NEGATIVE Final    Comment:        The GeneXpert MRSA Assay (FDA approved for NASAL specimens only), is one component of a comprehensive MRSA colonization surveillance program. It is not intended to diagnose MRSA infection nor to guide or monitor treatment for MRSA infections. Performed at [facility], [address] [place], [place] [address]       Studies: No results found.  Scheduled Meds: . folic acid  1 mg Oral Daily  . lidocaine (PF)      . multivitamin  with minerals  1 tablet Oral Daily  . pantoprazole  40 mg Oral Daily  . thiamine  100 mg Oral Daily    Continuous Infusions:    LOS: 1 day    Hollice Espy, MD [facility]  To reach me or the doctor on call, go to: [email or web address] Password [email or web address]  08/08/2017, 2:20 PM

## 2017-08-08 NOTE — Progress Notes (Signed)
returned from IR. BP stable, paracentesis site without drainage. Abdomen less taut. Sitting up eating lunch, no acute distress.

## 2017-08-08 NOTE — Care Management Note (Signed)
Case Management Note Donn PieriniKristi Callan Norden RN, BSN Unit 4E-Case Manager-- 2H coverage [phone]  Patient Details  Name: Vincent [name] MRN: [id number] Date of Birth: 07/06/1985  Subjective/Objective:  Pt admitted with Cirrhosis of liver with ascites/ hyponatremia                  Action/Plan: PTA pt lived at home with spouse, independent, anticipate return home- PCP- Vincent [name], CM to follow for transition of care needs.   Expected Discharge Date:                  Expected Discharge Plan:  Home/Self Care  In-House Referral:     Discharge planning Services  CM Consult  Post Acute Care Choice:    Choice offered to:     DME Arranged:    DME Agency:     HH Arranged:    HH Agency:     Status of Service:  In process, will continue to follow  If discussed at Long Length of Stay Meetings, dates discussed:    Discharge Disposition:   Additional Comments:  Vincent [name], Vincent Vantine Hall, RN 08/08/2017, 10:34 AM

## 2017-08-08 NOTE — Progress Notes (Signed)
CRITICAL VALUE ALERT  Critical Value: NA 116  Date & Time Notied: 08/08/17 0145  Provider Notified: Pola CornElink  Orders Received/Actions taken: Increase Rate

## 2017-08-08 NOTE — Progress Notes (Signed)
Daily Rounding Note  08/08/2017, 1:13 PM  LOS: 1 day   SUBJECTIVE:   Chief complaint: abd distention better after 5 liter paracentesis at 12:50 PM today.  Fluid studies are pending.  No nausea.   Swelling in legs is better.   When MRI mentioned, pt said he would need anxiolytic for claustrophobia.  Apparently 1 mg Ativan did not help when given.    OBJECTIVE:         Vital signs in last 24 hours:    Temp:  [98.4 F (36.9 C)-98.6 F (37 C)] 98.5 F (36.9 C) (04/18 0734) Pulse Rate:  [90-116] 96 (04/18 0800) Resp:  [14-29] 18 (04/18 1300) BP: (101-137)/(58-112) 122/79 (04/18 1300) SpO2:  [93 %-100 %] 95 % (04/18 0800) Last BM Date: (PTA) There were no vitals filed for this visit. General: alert, not ill looking   Heart: RRR Chest: clear bil.  No cough or labored Abdomen: soft, NT, active BS, distended/protuberant vs obese.    Extremities: slight barely pitting edema in feet (improved from edema to knees Neuro/Psych:  Pleasant, talkative.  No asterixis or gross deficits.  No tremor.  Seems anxious.    Intake/Output from previous day: 04/17 0701 - 04/18 0700 In: 1229.8 [I.V.:1229.8] Out: 575 [Urine:575]  Intake/Output this shift: Total I/O In: 120 [P.O.:120] Out: 600 [Urine:600]  Lab Results: Recent Labs    08/06/17 1624 08/06/17 2208 08/06/17 2311  WBC 16.2* 16.4*  --   HGB 14.9 14.7 15.3  HCT 40.5 38.7* 45.0  PLT 386* 363  --    BMET Recent Labs    08/08/17 0513 08/08/17 0707 08/08/17 1024  NA 119* 118* 121*  K 3.7 3.6 3.5  CL 85* 84* 84*  CO2 26 26 27   GLUCOSE 97 97 116*  BUN 7 7 5*  CREATININE 1.18 1.13 1.24  CALCIUM 7.9* 7.7* 8.2*   LFT Recent Labs    08/06/17 1624 08/06/17 2208 08/07/17 0317  PROT 5.8* 6.1* 5.2*  ALBUMIN 3.1* 2.9* 2.4*  AST 218* 213* 177*  ALT 72* 72* 60  ALKPHOS 202* 184* 154*  BILITOT 4.4* 5.3* 4.8*  BILIDIR 2.56*  --   --   IBILI 1.84*  --   --     PT/INR Recent Labs    08/06/17 1624 08/07/17 0047  LABPROT 17.0* 18.3*  INR 1.7* 1.54   Hepatitis Panel Recent Labs    08/07/17 0047  HEPBSAG Negative  HCVAB <0.1  HEPAIGM Negative  HEPBIGM Negative    Studies/Results: [place] Abdomen Complete  Result Date: 08/06/2017 CLINICAL DATA:  32 year old male with a history of ascites and vomiting EXAM: ABDOMEN ULTRASOUND COMPLETE COMPARISON:  None. FINDINGS: Gallbladder: Gallbladder wall thickening. Negative sonographic Murphy sign. Minimal degree of dependently layered sludge/microlithiasis without posterior shadowing. Common bile duct: Diameter: 7 mm-8 mm Liver: Nodular contour of the liver with diffusely heterogeneous echotexture. Focal cystic lesion within the right liver measures measures approximately 1.4 cm. Poor sonographic window, limiting evaluation of the main portal vein and right portal vein. The left portal vein is patent with hepatopetal flow. IVC: No abnormality visualized. Pancreas: Visualized portion unremarkable. Spleen: Greatest diameter of the spleen measures 7.8 cm Right Kidney: Length: 9.7 cm. Echogenicity within normal limits. No mass or hydronephrosis visualized. Left Kidney: Length: 10.9 cm. Echogenicity within normal limits. No mass or hydronephrosis visualized. Abdominal aorta: No aneurysm visualized. Other findings: Ascites IMPRESSION: Cirrhosis and ascites, suggesting portal hypertension. The portal venous system is poorly  visualized on the current study, and if there is concern for further evaluation, contrast-enhanced CT or MRI would be suggested. Gallbladder wall thickening most likely related to chronic liver disease. Small amount of biliary sludge/microlithiasis without sonographic evidence of acute cholecystitis. Electronically Signed   By: Gilmer Mor D.O.   On: 08/06/2017 19:01   Dg Chest Port 1 View  Result Date: 08/06/2017 CLINICAL DATA:  Ascites EXAM: PORTABLE CHEST 1 VIEW COMPARISON:  None. FINDINGS: Normal  cardiac silhouette. Low lung volumes. No edema, infiltrate or pneumothorax. Band of atelectasis at the LEFT lung base. IMPRESSION: No infiltrate or edema. Electronically Signed   By: Genevive Bi M.D.   On: 08/06/2017 22:05   Scheduled Meds: . folic acid  1 mg Oral Daily  . lidocaine (PF)      . multivitamin with minerals  1 tablet Oral Daily  . pantoprazole  40 mg Oral Daily  . thiamine  100 mg Oral Daily   Continuous Infusions: PRN Meds:.lidocaine (PF), LORazepam **OR** LORazepam, ondansetron **OR** ondansetron (ZOFRAN) IV, ondansetron, simethicone   ASSESMENT:   *   Jaundice, ascites, new dx cirrhosis.  Probably ETOH related but multiple tests pending to r/o AIH, metabollic causes.   INR elevated at 1.7.     HCV Ab negative Hep A IgM negative Hep B surface Ag and core IgM negative.  Ceruloplasmin normal.   Alpha 1 AT normal.  ANA pending.    IgG normal  Smooth muscle Ab pending.  AFP normal, 2.8.   Iron normal.  TIBC decreased.  Iron sat elevated.   Ferritin 1483  No LFTs this AM.    *  Hypvolemic, hyponatremia improved.  Off hypertonic saline infusion and will transfer off ICU today.    *   Anemia.    *  Ascites.  5 liter tap today.  Per Renal: "continue to track Na closely and once levels off/ plateaus would initiate diuretics to augment aquaresis"    PLAN   *  LFTs, coags, Hemochromatosis DNA in AM.   LFTs ordered, not yet collected  Did not order MRI as will need some sort of anxiolytic beforehand and need to determine how to manage with MDs.     Jennye Moccasin  08/08/2017, 1:13 PM Phone [phone]

## 2017-08-08 NOTE — Progress Notes (Signed)
eLink Physician-Brief Progress Note Patient Name: Vincent [name] DOB: 1985-06-12 MRN: [id number]   Date of Service  08/08/2017  HPI/Events of Note  Profound hyponatremia. No interval change in serum sodium on 3 % saline infusion at 10 ml/ hr  eICU Interventions  Increase infusion rate to 20 ml/ hr and continue to monitor serum sodium serially.     Intervention Category Major Interventions: Electrolyte abnormality - evaluation and management  Idil Maslanka U Tona Qualley 08/08/2017, 1:47 AM

## 2017-08-08 NOTE — Progress Notes (Signed)
[name] Progress Note    Assessment/ Plan:   1.  Severe hypervolemic hyponatremia: Improved on 3% NS which has been stopped now, 113--> 108--> 119--> 118.  Today, I would continue to track Na closely and once levels off/ plateaus would initiate diuretics to augment aquaresis.    2.  New cirrhosis with likely portal HTN and ascites: GI consulted and workup is ongoing.  Checking iron, A1AT, ceruloplasmin, ANA, Anti-smooth muscle, and AFP.  Paracentesis planned as is MRI.  3.  EtOH use: monitor for withdrawal   Subjective:    Na rising overnight as desired, peaked at 119, now is 118.  Hypertonic reduced overnight and then stopped this AM.  No complaints this morning.     Objective:   BP 110/75   Pulse 96   Temp 98.5 F (36.9 C) (Oral)   Resp 16   SpO2 95%   Intake/Output Summary (Last 24 hours) at 08/08/2017 0901 Last data filed at 08/08/2017 0800 Gross per 24 hour  Intake 598.58 ml  Output 875 ml  Net -276.42 ml   Weight change:   Physical Exam: GEN NAD, lying in bed, sleeping and easily arousable HEENT EOMI, PERRL, slightly jaundiced with scleral icterus NECK + prominent JVP, unchanged PULM clear bilaterally normal WOB no c/w/r CV slightly tachy, no m/r/g ABD + distention with some fluid wave, cannot feel liver edge EXT 1+ LE edema, unchanged NEURO AAO x 3, No asterixis SKIN warm and dry, no rashes or ecchymoses   Imaging: US Abdomen Complete  Result Date: 08/06/2017 CLINICAL DATA:  32 year old male with a history of ascites and vomiting EXAM: ABDOMEN ULTRASOUND COMPLETE COMPARISON:  None. FINDINGS: Gallbladder: Gallbladder wall thickening. Negative sonographic Murphy sign. Minimal degree of dependently layered sludge/microlithiasis without posterior shadowing. Common bile duct: Diameter: 7 mm-8 mm Liver: Nodular contour of the liver with diffusely heterogeneous echotexture. Focal cystic lesion within the right liver measures measures approximately  1.4 cm. Poor sonographic window, limiting evaluation of the main portal vein and right portal vein. The left portal vein is patent with hepatopetal flow. IVC: No abnormality visualized. Pancreas: Visualized portion unremarkable. Spleen: Greatest diameter of the spleen measures 7.8 cm Right Kidney: Length: 9.7 cm. Echogenicity within normal limits. No mass or hydronephrosis visualized. Left Kidney: Length: 10.9 cm. Echogenicity within normal limits. No mass or hydronephrosis visualized. Abdominal aorta: No aneurysm visualized. Other findings: Ascites IMPRESSION: Cirrhosis and ascites, suggesting portal hypertension. The portal venous system is poorly visualized on the current study, and if there is concern for further evaluation, contrast-enhanced CT or MRI would be suggested. Gallbladder wall thickening most likely related to chronic liver disease. Small amount of biliary sludge/microlithiasis without sonographic evidence of acute cholecystitis. Electronically Signed   By: Gilmer Mor D.O.   On: 08/06/2017 19:01   Dg Chest Port 1 View  Result Date: 08/06/2017 CLINICAL DATA:  Ascites EXAM: PORTABLE CHEST 1 VIEW COMPARISON:  None. FINDINGS: Normal cardiac silhouette. Low lung volumes. No edema, infiltrate or pneumothorax. Band of atelectasis at the LEFT lung base. IMPRESSION: No infiltrate or edema. Electronically Signed   By: Genevive Bi M.D.   On: 08/06/2017 22:05    Labs: BMET Recent Labs  Lab 08/07/17 0317  08/07/17 1544 08/07/17 1800 08/07/17 2047 08/08/17 0027 08/08/17 0244 08/08/17 0513 08/08/17 0707  NA 108*   < > 113* 113* 116* 116* 117* 119* 118*  K 3.4*  --   --  3.1* 2.8* 3.2* 3.3* 3.7 3.6  CL 69*  --   --  79* 82* 79* 83* 85* 84*  CO2 26  --   --  26 24 27 26 26 26   GLUCOSE 90  --   --  104* 111* 104* 101* 97 97  BUN 7  --   --  5* 5* 7 6 7 7   CREATININE 1.30*  --   --  1.21 1.17 1.27* 1.16 1.18 1.13  CALCIUM 7.9*  --   --  7.6* 7.6* 7.8* 7.7* 7.9* 7.7*   < > = values in  this interval not displayed.   CBC Recent Labs  Lab 08/06/17 1624 08/06/17 2208 08/06/17 2311  WBC 16.2* 16.4*  --   NEUTROABS 12.7* 12.1*  --   HGB 14.9 14.7 15.3  HCT 40.5 38.7* 45.0  MCV 100* 95.1  --   PLT 386* 363  --     Medications:    . folic acid  1 mg Oral Daily  . multivitamin with minerals  1 tablet Oral Daily  . pantoprazole  40 mg Oral Daily  . thiamine  100 mg Oral Daily      Bufford ButtnerElizabeth Teila Skalsky, MD [facility] pgr [phone] 08/08/2017, 9:01 AM

## 2017-08-08 NOTE — Procedures (Signed)
PROCEDURE SUMMARY:  Successful US guided paracentesis from right lateral abdomen.  Yielded 5.0 liters of clear, yellow fluid.  No immediate complications.  Pt tolerated well.   Specimen was sent for labs.  Hoyt KochKacie Sue-Ellen [place] PA-C 08/08/2017 12:51 PM

## 2017-08-08 NOTE — Progress Notes (Signed)
eLink Physician-Brief Progress Note Patient Name: Vincent [name] DOB: 7[name]/31/1987 MRN: [id number]   Date of Service  08/08/2017  HPI/Events of Note  dyspepsia  eICU Interventions  Simethicone 80 mg Q 6 hrs prn dyspepsia     Intervention Category Minor Interventions: Other:  Migdalia Dkkoronkwo U Vincent [name] 08/08/2017, 12:08 AM

## 2017-08-08 NOTE — Progress Notes (Signed)
Dr Rito EhrlichKrishnan paged in regard to                                K+ level 3.2 no new orders

## 2017-08-08 NOTE — Progress Notes (Signed)
Patient down to IR via bed with telemetry for paracentesis.

## 2017-08-08 NOTE — Progress Notes (Signed)
CRITICAL VALUE ALERT  Critical Value: Na 117  Date & Time Notied: 08/08/17 0340   Provider Notified: Pola CornElink  Orders Received/Actions taken: No new orders at this time.

## 2017-08-08 NOTE — Plan of Care (Signed)
Improved after paracentesis. VS stable, SBP> 100. Ambulating to BR with minimal assist. Has had one dose of ativan today, for c/o of anxiety. CIWA score negative. Neuro checks unremarkable, normal, sodium trending up, BMP every 4 hours to assess electrolytes.

## 2017-08-08 NOTE — Progress Notes (Signed)
CRITICAL VALUE ALERT  Critical Value: Na 119   Date & Time Notied: 08/08/17 [id number]  Provider Notified: Pola CornElink  Orders Received/Actions taken: Awaiting new orders

## 2017-08-08 NOTE — Progress Notes (Signed)
eLink Physician-Brief Progress Note Patient Name: Vincent [name] DOB: September 27, 1985 MRN: [id number]   Date of Service  08/08/2017  HPI/Events of Note  Serum Na+ up by 2 meq over past 4 hours which is appropriate.  eICU Interventions  Will reduce 3.5 % saline infusion rate to 15 ml/ hr and monitor Na+ level next 4 hours.     Intervention Category Intermediate Interventions: Electrolyte abnormality - evaluation and management  Migdalia DkOkoronkwo U Virdell Hoiland 08/08/2017, 6:41 AM

## 2017-08-08 NOTE — Progress Notes (Signed)
Patient refused sacral foam dressing and SCD's upon admission and educated.

## 2017-08-09 ENCOUNTER — Inpatient Hospital Stay (HOSPITAL_COMMUNITY): Payer: PRIVATE HEALTH INSURANCE

## 2017-08-09 DIAGNOSIS — K7030 Alcoholic cirrhosis of liver without ascites: Secondary | ICD-10-CM

## 2017-08-09 DIAGNOSIS — K7290 Hepatic failure, unspecified without coma: Secondary | ICD-10-CM

## 2017-08-09 LAB — CBC
HEMATOCRIT: 34.9 % — AB (ref 39.0–52.0)
HEMOGLOBIN: 12.5 g/dL — AB (ref 13.0–17.0)
MCH: 35.2 pg — AB (ref 26.0–34.0)
MCHC: 35.8 g/dL (ref 30.0–36.0)
MCV: 98.3 fL (ref 78.0–100.0)
Platelets: 225 10*3/uL (ref 150–400)
RBC: 3.55 MIL/uL — ABNORMAL LOW (ref 4.22–5.81)
RDW: 12.7 % (ref 11.5–15.5)
WBC: 9.4 10*3/uL (ref 4.0–10.5)

## 2017-08-09 LAB — BASIC METABOLIC PANEL
Anion gap: 7 (ref 5–15)
Anion gap: 10 (ref 5–15)
Anion gap: 10 (ref 5–15)
BUN: 5 mg/dL — ABNORMAL LOW (ref 6–20)
BUN: 5 mg/dL — AB (ref 6–20)
CALCIUM: 7.7 mg/dL — AB (ref 8.9–10.3)
CALCIUM: 8.2 mg/dL — AB (ref 8.9–10.3)
CALCIUM: 7.9 mg/dL — AB (ref 8.9–10.3)
CO2: 27 mmol/L (ref 22–32)
CO2: 26 mmol/L (ref 22–32)
CO2: 25 mmol/L (ref 22–32)
CREATININE: 1.06 mg/dL (ref 0.61–1.24)
CREATININE: 1.01 mg/dL (ref 0.61–1.24)
Chloride: 89 mmol/L — ABNORMAL LOW (ref 101–111)
Chloride: 89 mmol/L — ABNORMAL LOW (ref 101–111)
Chloride: 90 mmol/L — ABNORMAL LOW (ref 101–111)
Creatinine, Ser: 0.96 mg/dL (ref 0.61–1.24)
GFR calc Af Amer: >60 mL/min (ref >60)
GFR calc Af Amer: >60 mL/min (ref >60)
GFR calc non Af Amer: >60 mL/min (ref >60)
GFR calc non Af Amer: >60 mL/min (ref >60)
GFR calc non Af Amer: >60 mL/min (ref >60)
GLUCOSE: 103 mg/dL — AB (ref 65–99)
Glucose, Bld: 95 mg/dL (ref 65–99)
Glucose, Bld: 136 mg/dL — ABNORMAL HIGH (ref 65–99)
POTASSIUM: 3.1 mmol/L — AB (ref 3.5–5.1)
Potassium: 3.4 mmol/L — ABNORMAL LOW (ref 3.5–5.1)
Potassium: 3.1 mmol/L — ABNORMAL LOW (ref 3.5–5.1)
SODIUM: 125 mmol/L — AB (ref 135–145)
SODIUM: 125 mmol/L — AB (ref 135–145)
Sodium: 123 mmol/L — ABNORMAL LOW (ref 135–145)

## 2017-08-09 LAB — COMPREHENSIVE METABOLIC PANEL
ALK PHOS: 124 U/L (ref 38–126)
ALT: 44 U/L (ref 17–63)
AST: 121 U/L — ABNORMAL HIGH (ref 15–41)
Albumin: 2.1 g/dL — ABNORMAL LOW (ref 3.5–5.0)
Anion gap: 8 (ref 5–15)
BILIRUBIN TOTAL: 2.1 mg/dL — AB (ref 0.3–1.2)
BUN: 5 mg/dL — ABNORMAL LOW (ref 6–20)
CALCIUM: 7.7 mg/dL — AB (ref 8.9–10.3)
CO2: 27 mmol/L (ref 22–32)
Chloride: 89 mmol/L — ABNORMAL LOW (ref 101–111)
Creatinine, Ser: 0.94 mg/dL (ref 0.61–1.24)
GFR calc non Af Amer: >60 mL/min (ref >60)
Glucose, Bld: 103 mg/dL — ABNORMAL HIGH (ref 65–99)
Potassium: 3.1 mmol/L — ABNORMAL LOW (ref 3.5–5.1)
Sodium: 124 mmol/L — ABNORMAL LOW (ref 135–145)
TOTAL PROTEIN: 4.6 g/dL — AB (ref 6.5–8.1)

## 2017-08-09 LAB — MAGNESIUM: Magnesium: 1.8 mg/dL (ref 1.7–2.4)

## 2017-08-09 MED ORDER — ALPRAZOLAM 0.5 MG PO TABS
0.5000 mg | ORAL_TABLET | Freq: Once | ORAL | Status: AC
  Administered 2017-08-09: 0.5 mg via ORAL
  Filled 2017-08-09: qty 1

## 2017-08-09 MED ORDER — POTASSIUM CHLORIDE CRYS ER 20 MEQ PO TBCR
40.0000 meq | EXTENDED_RELEASE_TABLET | Freq: Once | ORAL | Status: AC
  Administered 2017-08-09: 40 meq via ORAL
  Filled 2017-08-09: qty 2

## 2017-08-09 MED ORDER — DIAZEPAM 5 MG/ML IJ SOLN
5.0000 mg | Freq: Once | INTRAMUSCULAR | Status: AC
  Administered 2017-08-09: 5 mg via INTRAVENOUS
  Filled 2017-08-09: qty 2

## 2017-08-09 MED ORDER — FUROSEMIDE 40 MG PO TABS
40.0000 mg | ORAL_TABLET | Freq: Every day | ORAL | Status: DC
  Administered 2017-08-09 – 2017-08-11 (×3): 40 mg via ORAL
  Filled 2017-08-09 (×3): qty 1

## 2017-08-09 MED ORDER — GADOBENATE DIMEGLUMINE 529 MG/ML IV SOLN
20.0000 mL | Freq: Once | INTRAVENOUS | Status: AC | PRN
  Administered 2017-08-09: 20 mL via INTRAVENOUS

## 2017-08-09 NOTE — Progress Notes (Signed)
1840 Pt is claiming that he is claustrophobic, he wants a pre med prior to MRI. Dr Caleb PoppNettey notified.

## 2017-08-09 NOTE — Progress Notes (Signed)
PROGRESS NOTE    Vincent [name]  JYN:[id number] DOB: 11-Nov-1985 DOA: 08/06/2017 PCP: Thomasene Lotpalski, Deborah, DO   Brief Narrative: Vincent [name] is a 32 y.o. male with a history of ADHD, alcohol use. He presented with abdominal and lower extremity swelling found to have cirrhosis of the liver with associated ascites, in addition to severe hyponatremia. His hyponatremia has been treated with hypertonic saline and he is s/p paracentesis on 4/18 yielding 5 L of fluid.   Assessment & Plan:   Principal Problem:   Cirrhosis of liver with ascites (HCC) Active Problems:   ADHD (attention deficit hyperactivity disorder)   Hyponatremia   Coagulopathy (HCC)   Cirrhosis of the liver with ascites Likely alcoholic cirrhosis per GI assessment. S/p thoracentesis yielding 5L of fluid. Not consistent with SBP. Plan for MRI per GI. -GI recommendations  Hyponatremia Hypervolemic. Improved s/p hypertonic saline, now has stabilized. Nephrology on board. Considering starting diuretics to further improve solidum. -Nephrology recommendations  History of panic attacks Takes Xanax prn as an outpatient.  Coagulopathy INR 1.7 on admission. Down to 1.54. Secondary to liver disease.  Alcohol abuse Currently without withdrawal symptoms. -CIWA   DVT prophylaxis: SCDs Code Status:   Code Status: Full Code Family Communication: None at bedside Disposition Plan: Discharge home pending resolution of hyponatremia and GI workup for cirrhosis   Consultants:   Gastroenterology  Nephrology  Procedures:   Paracentesis (4/18)  Antimicrobials:  None    Subjective: No abdominal pain.  Objective: Vitals:   08/08/17 2000 08/08/17 2100 08/08/17 2143 08/09/17 0536  BP:   121/81 120/71  Pulse:   (!) 109 (!) 105  Resp: 18 17 17 18   Temp:   98.8 F (37.1 C) 98.6 F (37 C)  TempSrc:   Oral Oral  SpO2:   97% 95%    Intake/Output Summary (Last 24 hours) at 08/09/2017 0749 Last data filed at 08/09/2017  0537 Gross per 24 hour  Intake 340 ml  Output 5975 ml  Net -5635 ml   There were no vitals filed for this visit.  Examination:  General exam: Appears calm and comfortable Respiratory system: Clear to auscultation. Respiratory effort normal. Cardiovascular system: S1 & S2 heard, RRR. No murmurs, rubs, gallops or clicks. Gastrointestinal system: Abdomen is very slightly distended, soft and nontender. No organomegaly or masses felt. Normal bowel sounds heard. Central nervous system: Alert and oriented. No focal neurological deficits. Extremities: Trace bilateral LE edema. No calf tenderness Skin: No cyanosis. No rashes Psychiatry: Judgement and insight appear normal. Mood & affect appropriate.     Data Reviewed: I have personally reviewed following labs and imaging studies  CBC: Recent Labs  Lab 08/06/17 1624 08/06/17 2208 08/06/17 2311 08/09/17 0344  WBC 16.2* 16.4*  --  9.4  NEUTROABS 12.7* 12.1*  --   --   HGB 14.9 14.7 15.3 12.5*  HCT 40.5 38.7* 45.0 34.9*  MCV 100* 95.1  --  98.3  PLT 386* 363  --  225   Basic Metabolic Panel: Recent Labs  Lab 08/08/17 1024 08/08/17 1434 08/08/17 1844 08/08/17 2205 08/09/17 0344  NA 121* 123*  122* 123* 122* 123*  124*  K 3.5 3.2* 3.3* 3.2* 3.1*  3.1*  CL 84* 87* 87* 87* 89*  89*  CO2 27 26 26 26 27  27   GLUCOSE 116* 118* 109* 126* 103*  103*  BUN 5* 6 5* 6 5*  5*  CREATININE 1.24 1.12 1.14 1.08 0.96  0.94  CALCIUM 8.2* 8.0* 8.0* 7.8*  7.7*  7.7*  MG  --   --   --   --  1.8   GFR: Estimated Creatinine Clearance: 122.9 mL/min (by C-G formula based on SCr of 0.96 mg/dL). Liver Function Tests: Recent Labs  Lab 08/06/17 1624 08/06/17 2208 08/07/17 0317 08/08/17 1434 08/09/17 0344  AST 218* 213* 177* 140* 121*  ALT 72* 72* 60 50 44  ALKPHOS 202* 184* 154* 138* 124  BILITOT 4.4* 5.3* 4.8* 2.7* 2.1*  PROT 5.8* 6.1* 5.2* 5.0* 4.6*  ALBUMIN 3.1* 2.9* 2.4* 2.3* 2.1*   Recent Labs  Lab 08/06/17 1624  LIPASE  86*  AMYLASE 58   Recent Labs  Lab 08/06/17 2208  AMMONIA 78*   Coagulation Profile: Recent Labs  Lab 08/06/17 1624 08/07/17 0047  INR 1.7* 1.54   Cardiac Enzymes: No results for input(s): CKTOTAL, CKMB, CKMBINDEX, TROPONINI in the last 168 hours. BNP (last 3 results) No results for input(s): PROBNP in the last 8760 hours. HbA1C: Recent Labs    08/06/17 1624  HGBA1C 4.3*   CBG: No results for input(s): GLUCAP in the last 168 hours. Lipid Profile: Recent Labs    08/06/17 1624  CHOL 195  HDL 46  LDLCALC 132*  TRIG 86  CHOLHDL 4.2  LDLDIRECT 149*   Thyroid Function Tests: Recent Labs    08/06/17 1624  TSH 4.940*  FREET4 1.79*   Anemia Panel: Recent Labs    08/07/17 1009  FERRITIN 1,483*  TIBC 104*  IRON 91   Sepsis Labs: No results for input(s): PROCALCITON, LATICACIDVEN in the last 168 hours.  Recent Results (from the past 240 hour(s))  MRSA PCR Screening     Status: None   Collection Time: 08/07/17 11:34 PM  Result Value Ref Range Status   MRSA by PCR NEGATIVE NEGATIVE Final    Comment:        The GeneXpert MRSA Assay (FDA approved for NASAL specimens only), is one component of a comprehensive MRSA colonization surveillance program. It is not intended to diagnose MRSA infection nor to guide or monitor treatment for MRSA infections. Performed at [facility], [address], [place], [place] [address]   Gram stain     Status: None   Collection Time: 08/08/17 12:30 PM  Result Value Ref Range Status   Specimen Description PERITONEAL FLUID  Final   Special Requests NONE  Final   Gram Stain   Final    WBC PRESENT, PREDOMINANTLY MONONUCLEAR NO ORGANISMS SEEN CYTOSPIN SMEAR Performed at [facility], [address], [place], [place] [address]    Report Status 08/08/2017 FINAL  Final         Radiology Studies: Ir Paracentesis  Result Date: 08/08/2017 INDICATION: Patient with cirrhosis of the liver and ascites. Request is  made for diagnostic and therapeutic paracentesis. EXAM: ULTRASOUND GUIDED DIAGNOSTIC AND THERAPEUTIC PARACENTESIS MEDICATIONS: 10 mL 2% lidocaine COMPLICATIONS: None immediate. PROCEDURE: Informed written consent was obtained from the patient after a discussion of the risks, benefits and alternatives to treatment. A timeout was performed prior to the initiation of the procedure. Initial ultrasound scanning demonstrates a moderate amount of ascites within the right lower abdominal quadrant. The right lower abdomen was prepped and draped in the usual sterile fashion. 2% lidocaine was used for local anesthesia. Following this, a 19 gauge, 7-cm, Yueh catheter was introduced. An ultrasound image was saved for documentation purposes. The paracentesis was performed. The catheter was removed and a dressing was applied. The patient tolerated the  procedure well without immediate post procedural complication. FINDINGS: A total of approximately 5.0 liters of clear, yellow fluid was removed. Samples were sent to the laboratory as requested by the clinical team. IMPRESSION: Successful ultrasound-guided diagnostic and therapeutic paracentesis yielding 5.0 liters of peritoneal fluid. Read by: Loyce Dys PA-C Electronically Signed   By: Malachy Moan M.D.   On: 08/08/2017 12:56        Scheduled Meds: . folic acid  1 mg Oral Daily  . multivitamin with minerals  1 tablet Oral Daily  . pantoprazole  40 mg Oral Daily  . potassium chloride  40 mEq Oral Once  . thiamine  100 mg Oral Daily   Continuous Infusions:   LOS: 2 days     Jacquelin Hawking, MD [facility] 08/09/2017, 7:49 AM Pager: [phone]  If 7PM-7AM, please contact night-coverage [email or web address] Password [email or web address] 08/09/2017, 7:49 AM

## 2017-08-09 NOTE — Progress Notes (Signed)
[facility] Progress Note    Assessment/ Plan:   1.  Severe hypervolemic hyponatremia: Improved on 3% NS which has been stopped now, 113--> 108--> 119--> 118--> 123-124 and is stable.  OK to start diuretics.  I would probably start Lasix first, assess Na response, and then add aldactone in appropriate ratio.  2.  New cirrhosis with likely portal HTN and ascites: GI consulted and workup is ongoing.  s/p paracentesis with transudative SAAG, iron studies with % sat slightly elevated, hemochromatosis workup ongoing.  Hepatitis panel negative, HIV negative, A1AT negative, ANA negative, anti-Smooth muscle antibody negative, and ceruloplasmin WNL.  3.  EtOH use: monitor for withdrawal   Subjective:    Na has stabilized out 123-124.  Pt without complaint.  Ok to start diuretics.  S/p 5L para yesterday with transudative SAAG.   Objective:   BP 120/71 (BP Location: Right Arm)   Pulse (!) 105   Temp 98.6 F (37 C) (Oral)   Resp 18   SpO2 95%   Intake/Output Summary (Last 24 hours) at 08/09/2017 1312 Last data filed at 08/09/2017 0950 Gross per 24 hour  Intake 520 ml  Output 5377 ml  Net -4857 ml   Weight change:   Physical Exam: GEN NAD, lying in bed, sleeping and easily arousable HEENT EOMI, PERRL, slightly jaundiced with scleral icterus NECK JVP improved PULM clear bilaterally normal WOB no c/w/r CV slightly tachy, no m/r/g ABD + distention improved EXT 1+ LE edema, unchanged NEURO AAO x 3, No asterixis SKIN warm and dry, no rashes or ecchymoses   Imaging: Ir Paracentesis  Result Date: 08/08/2017 INDICATION: Patient with cirrhosis of the liver and ascites. Request is made for diagnostic and therapeutic paracentesis. EXAM: ULTRASOUND GUIDED DIAGNOSTIC AND THERAPEUTIC PARACENTESIS MEDICATIONS: 10 mL 2% lidocaine COMPLICATIONS: None immediate. PROCEDURE: Informed written consent was obtained from the patient after a discussion of the risks, benefits and  alternatives to treatment. A timeout was performed prior to the initiation of the procedure. Initial ultrasound scanning demonstrates a moderate amount of ascites within the right lower abdominal quadrant. The right lower abdomen was prepped and draped in the usual sterile fashion. 2% lidocaine was used for local anesthesia. Following this, a 19 gauge, 7-cm, Yueh catheter was introduced. An ultrasound image was saved for documentation purposes. The paracentesis was performed. The catheter was removed and a dressing was applied. The patient tolerated the procedure well without immediate post procedural complication. FINDINGS: A total of approximately 5.0 liters of clear, yellow fluid was removed. Samples were sent to the laboratory as requested by the clinical team. IMPRESSION: Successful ultrasound-guided diagnostic and therapeutic paracentesis yielding 5.0 liters of peritoneal fluid. Read by: Loyce Dys PA-C Electronically Signed   By: Malachy Moan M.D.   On: 08/08/2017 12:56    Labs: BMET Recent Labs  Lab 08/08/17 0707 08/08/17 1024 08/08/17 1434 08/08/17 1844 08/08/17 2205 08/09/17 0344 08/09/17 1159  NA 118* 121* 123*  122* 123* 122* 123*  124* 125*  K 3.6 3.5 3.2* 3.3* 3.2* 3.1*  3.1* 3.4*  CL 84* 84* 87* 87* 87* 89*  89* 89*  CO2 26 27 26 26 26 27  27 26   GLUCOSE 97 116* 118* 109* 126* 103*  103* 95  BUN 7 5* 6 5* 6 5*  5* 5*  CREATININE 1.13 1.24 1.12 1.14 1.08 0.96  0.94 1.06  CALCIUM 7.7* 8.2* 8.0* 8.0* 7.8* 7.7*  7.7* 8.2*   CBC Recent Labs  Lab 08/06/17 1624 08/06/17 2208  08/06/17 2311 08/09/17 0344  WBC 16.2* 16.4*  --  9.4  NEUTROABS 12.7* 12.1*  --   --   HGB 14.9 14.7 15.3 12.5*  HCT 40.5 38.7* 45.0 34.9*  MCV 100* 95.1  --  98.3  PLT 386* 363  --  225    Medications:    . folic acid  1 mg Oral Daily  . multivitamin with minerals  1 tablet Oral Daily  . pantoprazole  40 mg Oral Daily  . thiamine  100 mg Oral Daily      Bufford ButtnerElizabeth Sister Carbone,  MD [facility] pgr [phone] 08/09/2017, 1:12 PM

## 2017-08-09 NOTE — Progress Notes (Signed)
Daily Rounding Note  08/09/2017, 11:17 AM  LOS: 2 days   SUBJECTIVE:   No further increase in abdominal  Distention.  No pain and no nausea.  2 loose stools, brown, in last 24 hours.        OBJECTIVE:         Vital signs in last 24 hours:    Temp:  [98.4 F (36.9 C)-98.8 F (37.1 C)] 98.6 F (37 C) (04/19 0536) Pulse Rate:  [105-109] 105 (04/19 0536) Resp:  [15-21] 18 (04/19 0536) BP: (83-122)/(59-81) 120/71 (04/19 0536) SpO2:  [95 %-97 %] 95 % (04/19 0536) Last BM Date: 08/08/17 There were no vitals filed for this visit. General: slightly pale but looks well and comfortable   Heart: RRR Chest: clear bil.  No dyspnea Abdomen: soft, mildly distended.  NT.  Active BS  Extremities: very slight pedal edema Neuro/Psych:  Alert, oriented x 3.  No asterixis  Intake/Output from previous day: 04/18 0701 - 04/19 0700 In: 340 [P.O.:340] Out: 5975 [Urine:975]  Intake/Output this shift: Total I/O In: 300 [P.O.:300] Out: 2 [Urine:1; Stool:1]  Lab Results: Recent Labs    08/06/17 1624 08/06/17 2208 08/06/17 2311 08/09/17 0344  WBC 16.2* 16.4*  --  9.4  HGB 14.9 14.7 15.3 12.5*  HCT 40.5 38.7* 45.0 34.9*  PLT 386* 363  --  225   BMET Recent Labs    08/08/17 1844 08/08/17 2205 08/09/17 0344  NA 123* 122* 123*  124*  K 3.3* 3.2* 3.1*  3.1*  CL 87* 87* 89*  89*  CO2 26 26 27  27   GLUCOSE 109* 126* 103*  103*  BUN 5* 6 5*  5*  CREATININE 1.14 1.08 0.96  0.94  CALCIUM 8.0* 7.8* 7.7*  7.7*   LFT Recent Labs    08/06/17 1624  08/07/17 0317 08/08/17 1434 08/09/17 0344  PROT 5.8*   < > 5.2* 5.0* 4.6*  ALBUMIN 3.1*   < > 2.4* 2.3* 2.1*  AST 218*   < > 177* 140* 121*  ALT 72*   < > 60 50 44  ALKPHOS 202*   < > 154* 138* 124  BILITOT 4.4*   < > 4.8* 2.7* 2.1*  BILIDIR 2.56*  --   --  1.2*  --   IBILI 1.84*  --   --  1.5*  --    < > = values in this interval not displayed.   PT/INR Recent Labs     08/06/17 1624 08/07/17 0047  LABPROT 17.0* 18.3*  INR 1.7* 1.54   Hepatitis Panel Recent Labs    08/07/17 0047  HEPBSAG Negative  HCVAB <0.1  HEPAIGM Negative  HEPBIGM Negative    Studies/Results: Ir Paracentesis  Result Date: 08/08/2017 INDICATION: Patient with cirrhosis of the liver and ascites. Request is made for diagnostic and therapeutic paracentesis. EXAM: ULTRASOUND GUIDED DIAGNOSTIC AND THERAPEUTIC PARACENTESIS MEDICATIONS: 10 mL 2% lidocaine COMPLICATIONS: None immediate. PROCEDURE: Informed written consent was obtained from the patient after a discussion of the risks, benefits and alternatives to treatment. A timeout was performed prior to the initiation of the procedure. Initial ultrasound scanning demonstrates a moderate amount of ascites within the right lower abdominal quadrant. The right lower abdomen was prepped and draped in the usual sterile fashion. 2% lidocaine was used for local anesthesia. Following this, a 19 gauge, 7-cm, Yueh catheter was introduced. An ultrasound image was saved for documentation purposes. The paracentesis was performed. The catheter  was removed and a dressing was applied. The patient tolerated the procedure well without immediate post procedural complication. FINDINGS: A total of approximately 5.0 liters of clear, yellow fluid was removed. Samples were sent to the laboratory as requested by the clinical team. IMPRESSION: Successful ultrasound-guided diagnostic and therapeutic paracentesis yielding 5.0 liters of peritoneal fluid. Read by: Loyce Dys PA-C Electronically Signed   By: Malachy Moan M.D.   On: 08/08/2017 12:56    ASSESMENT:   *   Jaundice, ascites, new dx cirrhosis.  Probably due to ETOH.  Viral, autoimmune and most metabolic studies are all negative.  Hemochromatosis DNA pending to r/o hemochromatosis in light of elevated ferritin.    INR elevated at 1.7.     HCV Ab negative Hep A IgM negative Hep B surface Ag and core  IgM negative.  Ceruloplasmin normal.   Alpha 1 AT normal.  ANA negative.    IgG normal  Smooth muscle Ab negative.  AFP normal, 2.8.   Iron normal.  TIBC decreased.  Iron sat elevated.   Ferritin 1483  *  Hypovolemic, hyponatremia improved, not resolved.  Off hypertonic saline infusion as of 4/18.    *  Ascites.  5 liter tap today.  Fluid WBCs 50: no SBP.  Albumin <1.  SAAG ~ 2.2 which is not indicative of portl htn.    Per Renal: "continue to track Na closely and once levels off/ plateaus would initiate diuretics to augment aquaresis"  *   Hypokalemia.      PLAN   *  ? Timing of MRI: pt says he will need anxiolytic for this  *  Await hemochromatosis DNA.    *    EGD, MD likely will defer to outpt setting  Jennye Moccasin  08/09/2017, 11:17 AM Phone [phone]

## 2017-08-10 ENCOUNTER — Inpatient Hospital Stay (HOSPITAL_COMMUNITY): Payer: PRIVATE HEALTH INSURANCE

## 2017-08-10 DIAGNOSIS — K7210 Chronic hepatic failure without coma: Secondary | ICD-10-CM

## 2017-08-10 DIAGNOSIS — R0989 Other specified symptoms and signs involving the circulatory and respiratory systems: Secondary | ICD-10-CM

## 2017-08-10 DIAGNOSIS — K7290 Hepatic failure, unspecified without coma: Secondary | ICD-10-CM

## 2017-08-10 DIAGNOSIS — F419 Anxiety disorder, unspecified: Secondary | ICD-10-CM

## 2017-08-10 DIAGNOSIS — K7011 Alcoholic hepatitis with ascites: Secondary | ICD-10-CM

## 2017-08-10 DIAGNOSIS — R188 Other ascites: Secondary | ICD-10-CM

## 2017-08-10 DIAGNOSIS — R0689 Other abnormalities of breathing: Secondary | ICD-10-CM

## 2017-08-10 DIAGNOSIS — E876 Hypokalemia: Secondary | ICD-10-CM

## 2017-08-10 LAB — BASIC METABOLIC PANEL
ANION GAP: 8 (ref 5–15)
ANION GAP: 11 (ref 5–15)
Anion gap: 9 (ref 5–15)
BUN: 6 mg/dL (ref 6–20)
BUN: 7 mg/dL (ref 6–20)
BUN: 6 mg/dL (ref 6–20)
CALCIUM: 8.2 mg/dL — AB (ref 8.9–10.3)
CALCIUM: 7.9 mg/dL — AB (ref 8.9–10.3)
CHLORIDE: 91 mmol/L — AB (ref 101–111)
CO2: 29 mmol/L (ref 22–32)
CO2: 25 mmol/L (ref 22–32)
CO2: 24 mmol/L (ref 22–32)
Calcium: 8.2 mg/dL — ABNORMAL LOW (ref 8.9–10.3)
Chloride: 91 mmol/L — ABNORMAL LOW (ref 101–111)
Chloride: 92 mmol/L — ABNORMAL LOW (ref 101–111)
Creatinine, Ser: 1.07 mg/dL (ref 0.61–1.24)
Creatinine, Ser: 1.12 mg/dL (ref 0.61–1.24)
Creatinine, Ser: 1.13 mg/dL (ref 0.61–1.24)
GFR calc Af Amer: >60 mL/min (ref >60)
GFR calc non Af Amer: >60 mL/min (ref >60)
Glucose, Bld: 102 mg/dL — ABNORMAL HIGH (ref 65–99)
Glucose, Bld: 88 mg/dL (ref 65–99)
Glucose, Bld: 107 mg/dL — ABNORMAL HIGH (ref 65–99)
POTASSIUM: 3.8 mmol/L (ref 3.5–5.1)
Potassium: 3.3 mmol/L — ABNORMAL LOW (ref 3.5–5.1)
Potassium: 3.5 mmol/L (ref 3.5–5.1)
SODIUM: 128 mmol/L — AB (ref 135–145)
SODIUM: 127 mmol/L — AB (ref 135–145)
SODIUM: 125 mmol/L — AB (ref 135–145)

## 2017-08-10 LAB — HEPATIC FUNCTION PANEL
ALT: 46 U/L (ref 17–63)
AST: 126 U/L — ABNORMAL HIGH (ref 15–41)
Albumin: 2.4 g/dL — ABNORMAL LOW (ref 3.5–5.0)
Alkaline Phosphatase: 131 U/L — ABNORMAL HIGH (ref 38–126)
BILIRUBIN INDIRECT: 1.3 mg/dL — AB (ref 0.3–0.9)
Bilirubin, Direct: 1 mg/dL — ABNORMAL HIGH (ref 0.1–0.5)
TOTAL PROTEIN: 5.3 g/dL — AB (ref 6.5–8.1)
Total Bilirubin: 2.3 mg/dL — ABNORMAL HIGH (ref 0.3–1.2)

## 2017-08-10 MED ORDER — POTASSIUM CHLORIDE CRYS ER 20 MEQ PO TBCR
40.0000 meq | EXTENDED_RELEASE_TABLET | Freq: Once | ORAL | Status: AC
  Administered 2017-08-10: 40 meq via ORAL
  Filled 2017-08-10: qty 2

## 2017-08-10 MED ORDER — POTASSIUM CHLORIDE CRYS ER 20 MEQ PO TBCR
40.0000 meq | EXTENDED_RELEASE_TABLET | Freq: Once | ORAL | Status: AC
Start: 2017-08-10 — End: 2017-08-10
  Filled 2017-08-10: qty 2

## 2017-08-10 MED ORDER — HYDROXYZINE HCL 25 MG PO TABS
100.0000 mg | ORAL_TABLET | Freq: Four times a day (QID) | ORAL | Status: DC | PRN
  Administered 2017-08-10 – 2017-08-11 (×2): 100 mg via ORAL
  Filled 2017-08-10 (×2): qty 4

## 2017-08-10 MED ORDER — ALPRAZOLAM 0.5 MG PO TABS
0.5000 mg | ORAL_TABLET | Freq: Two times a day (BID) | ORAL | Status: DC | PRN
  Administered 2017-08-10: 0.5 mg via ORAL
  Filled 2017-08-10: qty 1

## 2017-08-10 MED ORDER — SPIRONOLACTONE 100 MG PO TABS
100.0000 mg | ORAL_TABLET | Freq: Every day | ORAL | Status: DC
  Administered 2017-08-10 – 2017-08-11 (×2): 100 mg via ORAL
  Filled 2017-08-10 (×2): qty 1

## 2017-08-10 MED ORDER — IOPAMIDOL (ISOVUE-300) INJECTION 61%
INTRAVENOUS | Status: AC
  Filled 2017-08-10: qty 100

## 2017-08-10 MED ORDER — IOPAMIDOL (ISOVUE-300) INJECTION 61%
100.0000 mL | Freq: Once | INTRAVENOUS | Status: AC | PRN
  Administered 2017-08-10: 100 mL via INTRAVENOUS

## 2017-08-10 MED ORDER — ALPRAZOLAM 0.5 MG PO TABS
0.5000 mg | ORAL_TABLET | Freq: Two times a day (BID) | ORAL | Status: DC | PRN
  Administered 2017-08-10 – 2017-08-11 (×3): 0.5 mg via ORAL
  Filled 2017-08-10 (×4): qty 1

## 2017-08-10 NOTE — Plan of Care (Signed)
  Problem: Safety: Goal: Ability to remain free from injury will improve Outcome: Progressing   Problem: Skin Integrity: Goal: Risk for impaired skin integrity will decrease Outcome: Progressing   

## 2017-08-10 NOTE — Progress Notes (Signed)
Patient does not want to be on telemetry anymore, MD on call is aware

## 2017-08-10 NOTE — Progress Notes (Signed)
[facility] Progress Note    Assessment/ Plan:   1.  Severe hypervolemic hyponatremia: Improved on 3% NS which has been stopped now, 113--> 108--> 119--> 118--> 123-124--> 127-128.  He's OK to titrate Lasix dose as appropriate, can add aldactone too.  I expect sodium to improve with improvement in volume status.  We will sign off at this time.  Please let us know when he will be discharged so we can determine the need for followup.  2.  New cirrhosis with likely portal HTN and ascites: GI consulted and workup is ongoing.  s/p paracentesis with transudative SAAG, iron studies with % sat slightly elevated, hemochromatosis workup ongoing.  Hepatitis panel negative, HIV negative, A1AT negative, ANA negative, anti-Smooth muscle antibody negative, and ceruloplasmin WNL. MRI with omental thickening--> CT abd/ pelvis today  3.  EtOH use: monitor for withdrawal   Subjective:    Na continues to improve, Lasix restarted yesterday.  Without complaint this AM- sleeping   Objective:   BP 106/77 (BP Location: Right Arm)   Pulse (!) 102   Temp 98.6 F (37 C) (Oral)   Resp 18   SpO2 96%   Intake/Output Summary (Last 24 hours) at 08/10/2017 1137 Last data filed at 08/10/2017 4098 Gross per 24 hour  Intake 240 ml  Output -  Net 240 ml   Weight change:   Physical Exam: GEN NAD, lying in bed, sleeping and easily arousable HEENT EOMI, PERRL, very slight scleral icterus NECK JVP improved PULM clear bilaterally normal WOB no c/w/r CV slightly tachy, no m/r/g ABD + distention back EXT LE edema, slightly improved NEURO AAO x 3, No asterixis SKIN warm and dry, no rashes or ecchymoses   Imaging: Mr Liver W Wo Contrast  Result Date: 08/09/2017 CLINICAL DATA:  Ascites, cirrhosis EXAM: MRI ABDOMEN WITHOUT AND WITH CONTRAST TECHNIQUE: Multiplanar multisequence MR imaging of the abdomen was performed both before and after the administration of intravenous contrast. CONTRAST:  20mL  MULTIHANCE GADOBENATE DIMEGLUMINE 529 MG/ML IV SOLN COMPARISON:  Ultrasound 08/06/2017 FINDINGS: Lower chest:  Small effusion Hepatobiliary: Mild diffuse loss of signal intensity within the liver on opposed phase imaging consistent with hepatic steatosis. Liver has a smooth contour. The caudate lobe is slightly enlarged. On the early arterial phase imaging there is no focal hepatic lesion shows hepatoma. There is a benign cyst in the LEFT hepatic lobe. Portal veins are patent. No biliary distension.  Common bile duct normal Pancreas: Normal pancreatic parenchymal intensity. No ductal dilatation or inflammation. Spleen: Normal spleen. Adrenals/urinary tract: Adrenal glands and kidneys are normal. Stomach/Bowel: There is eccentric thickening (2 cm) through the gastric cardia and gastric body along the greater curvature (image 38/200001). Duodenum limited view of the bowel is unremarkable. Vascular/Lymphatic: Abdominal aortic normal caliber. No retroperitoneal adenopathy. Significant periportal adenopathy. On the coronal imaging T2 weighted series there is extensive omental thickening in the lesser and greater omentum (image 29/3001). Interestingly this does not appear to enhance abnormally on postcontrast imaging although exam is limited There is a large volume of intraperitoneal free fluid. Musculoskeletal: No aggressive osseous lesion IMPRESSION: 1. Extensive omental thickening in the lesser and greater omentum. Recommend CT abdomen pelvis with contrast for further evaluation. Recommend cytology evaluation of ascitic fluid (thoracentesis performed 08/08/2017). 2. Large volume intraperitoneal free fluid. 3. Thickening of the gastric mucosa involving the greater curvature of the stomach. Recommend attention on above recommended CT and consider upper endoscopy if concern for inflammation or malignancy 4. Hepatic steatosis. No hepatoma identified.  Portal veins patent. No biliary obstruction Electronically Signed   By:  Genevive BiStewart  Edmunds M.D.   On: 08/09/2017 23:19   Ir Paracentesis  Result Date: 08/08/2017 INDICATION: Patient with cirrhosis of the liver and ascites. Request is made for diagnostic and therapeutic paracentesis. EXAM: ULTRASOUND GUIDED DIAGNOSTIC AND THERAPEUTIC PARACENTESIS MEDICATIONS: 10 mL 2% lidocaine COMPLICATIONS: None immediate. PROCEDURE: Informed written consent was obtained from the patient after a discussion of the risks, benefits and alternatives to treatment. A timeout was performed prior to the initiation of the procedure. Initial ultrasound scanning demonstrates a moderate amount of ascites within the right lower abdominal quadrant. The right lower abdomen was prepped and draped in the usual sterile fashion. 2% lidocaine was used for local anesthesia. Following this, a 19 gauge, 7-cm, Yueh catheter was introduced. An ultrasound image was saved for documentation purposes. The paracentesis was performed. The catheter was removed and a dressing was applied. The patient tolerated the procedure well without immediate post procedural complication. FINDINGS: A total of approximately 5.0 liters of clear, yellow fluid was removed. Samples were sent to the laboratory as requested by the clinical team. IMPRESSION: Successful ultrasound-guided diagnostic and therapeutic paracentesis yielding 5.0 liters of peritoneal fluid. Read by: Loyce DysKacie Matthews PA-C Electronically Signed   By: Malachy MoanHeath  McCullough M.D.   On: 08/08/2017 12:56    Labs: BMET Recent Labs  Lab 08/08/17 1844 08/08/17 2205 08/09/17 0344 08/09/17 1159 08/09/17 1712 08/09/17 2317 08/10/17 0640  NA 123* 122* 123*  124* 125* 125* 128* 127*  K 3.3* 3.2* 3.1*  3.1* 3.4* 3.1* 3.3* 3.5  CL 87* 87* 89*  89* 89* 90* 91* 91*  CO2 26 26 27  27 26 25 29 25   GLUCOSE 109* 126* 103*  103* 95 136* 102* 88  BUN 5* 6 5*  5* 5* <5* 6 7  CREATININE 1.14 1.08 0.96  0.94 1.06 1.01 1.07 1.12  CALCIUM 8.0* 7.8* 7.7*  7.7* 8.2* 7.9* 8.2* 8.2*    CBC Recent Labs  Lab 08/06/17 1624 08/06/17 2208 08/06/17 2311 08/09/17 0344  WBC 16.2* 16.4*  --  9.4  NEUTROABS 12.7* 12.1*  --   --   HGB 14.9 14.7 15.3 12.5*  HCT 40.5 38.7* 45.0 34.9*  MCV 100* 95.1  --  98.3  PLT 386* 363  --  225    Medications:    . folic acid  1 mg Oral Daily  . furosemide  40 mg Oral Daily  . iopamidol      . multivitamin with minerals  1 tablet Oral Daily  . pantoprazole  40 mg Oral Daily  . potassium chloride  40 mEq Oral Once  . thiamine  100 mg Oral Daily      Bufford ButtnerElizabeth Gianni Mihalik, MD [facility] pgr [phone] 08/10/2017, 11:37 AM

## 2017-08-10 NOTE — Progress Notes (Signed)
Progress Note   Subjective  Patient had MRI liver which led to CT scan abdomen. States he is feeling better. Does not think the lasix has done much yet, cleared to start aldactone per nephrology.   Objective   Vital signs in last 24 hours: Temp:  [98.6 F (37 C)] 98.6 F (37 C) (04/20 1512) Pulse Rate:  [102-110] 102 (04/20 1512) Resp:  [18] 18 (04/20 0623) BP: (93-128)/(68-89) 93/68 (04/20 1512) SpO2:  [96 %-100 %] 100 % (04/20 1512) Last BM Date: 08/09/17 General:    white male in NAD Heart:  Regular rate and rhythm; no murmurs Lungs: Respirations even and unlabored, lungs CTA bilaterally Abdomen:  Soft, nontender, distended with ascites.  Extremities:  Some LE edema. Neurologic:  Alert and oriented,  grossly normal neurologically. Psych:  Cooperative. Normal mood and affect.  Intake/Output from previous day: 04/19 0701 - 04/20 0700 In: 540 [P.O.:540] Out: 2 [Urine:1; Stool:1] Intake/Output this shift: No intake/output data recorded.  Lab Results: Recent Labs    08/09/17 0344  WBC 9.4  HGB 12.5*  HCT 34.9*  PLT 225   BMET Recent Labs    08/09/17 1712 08/09/17 2317 08/10/17 0640  NA 125* 128* 127*  K 3.1* 3.3* 3.5  CL 90* 91* 91*  CO2 25 29 25   GLUCOSE 136* 102* 88  BUN <5* 6 7  CREATININE 1.01 1.07 1.12  CALCIUM 7.9* 8.2* 8.2*   LFT Recent Labs    08/10/17 0640  PROT 5.3*  ALBUMIN 2.4*  AST 126*  ALT 46  ALKPHOS 131*  BILITOT 2.3*  BILIDIR 1.0*  IBILI 1.3*   PT/INR No results for input(s): LABPROT, INR in the last 72 hours.  Studies/Results: Ct Abdomen Pelvis W Contrast  Result Date: 08/10/2017 CLINICAL DATA:  Abnormal thickened omentum on prior MRI. Clinical history on that exam of cirrhosis and ascites. EXAM: CT ABDOMEN AND PELVIS WITH CONTRAST TECHNIQUE: Multidetector CT imaging of the abdomen and pelvis was performed using the standard protocol following bolus administration of intravenous contrast. CONTRAST:  ISOVUE-300  IOPAMIDOL (ISOVUE-300) INJECTION 61% COMPARISON:  MRI of 1 day prior FINDINGS: Lower chest: Mild right hemidiaphragm elevation with minimal anterior right lung base volume loss. Subsegmental atelectasis at the left lung base and right lower lobe as well. Normal heart size with trace left pleural fluid. Hepatobiliary: Heterogeneous hepatic steatosis. more focal steatosis in the pericholecystic region of the liver. Subcentimeter left hepatic lobe cyst. Mild nonspecific gallbladder mucosal hyperenhancement. No biliary duct dilatation. Pancreas: Normal, without mass or ductal dilatation. Spleen: Normal in size, without focal abnormality. Adrenals/Urinary Tract: Normal adrenal glands. Normal kidneys, without hydronephrosis. Stomach/Bowel: The greater curvature gastric wall thickening is no longer identified. Favor gastric contents along the lesser curvature of the stomach, including image 27/3. Portions of the colon are underdistended. Apparent wall thickening is at least partially felt to be secondary. Example image 46/3. Normal terminal ileum. Appendix not well visualized. Proximal and mid small bowel mild wall and fold thickening, including on image 50/3. Vascular/Lymphatic: Normal caliber of the aorta and branch vessels. Patent portal and hepatic veins. Borderline prominent and patent paraumbilical vein, including on image 44/3. No abdominopelvic adenopathy. Reproductive: Normal prostate. Other: Moderate volume abdominopelvic ascites. Omental edema, without well-defined omental or peritoneal mass. Musculoskeletal: No acute osseous abnormality. IMPRESSION: 1. Heterogeneous hepatic steatosis. Moderate volume abdominopelvic ascites and prominent paraumbilical vein. Favor cirrhosis and portal venous hypertension. 2. Omental edema, likely secondary to portal venous hypertension. No well-defined omental mass.  3. Small bowel wall/fold thickening. Concurrent colonic wall thickening, accentuated by underdistention. Findings  are nonspecific and could simply be attributed to low albumin (decreased yesterday). Infectious enteritis and/or colitis cannot be excluded. Findings are new since yesterday's MRI. 4. Trace left pleural fluid. Electronically Signed   By: Jeronimo GreavesKyle  Talbot M.D.   On: 08/10/2017 14:08   Mr Liver W Wo Contrast  Result Date: 08/09/2017 CLINICAL DATA:  Ascites, cirrhosis EXAM: MRI ABDOMEN WITHOUT AND WITH CONTRAST TECHNIQUE: Multiplanar multisequence MR imaging of the abdomen was performed both before and after the administration of intravenous contrast. CONTRAST:  20mL MULTIHANCE GADOBENATE DIMEGLUMINE 529 MG/ML IV SOLN COMPARISON:  Ultrasound 08/06/2017 FINDINGS: Lower chest:  Small effusion Hepatobiliary: Mild diffuse loss of signal intensity within the liver on opposed phase imaging consistent with hepatic steatosis. Liver has a smooth contour. The caudate lobe is slightly enlarged. On the early arterial phase imaging there is no focal hepatic lesion shows hepatoma. There is a benign cyst in the LEFT hepatic lobe. Portal veins are patent. No biliary distension.  Common bile duct normal Pancreas: Normal pancreatic parenchymal intensity. No ductal dilatation or inflammation. Spleen: Normal spleen. Adrenals/urinary tract: Adrenal glands and kidneys are normal. Stomach/Bowel: There is eccentric thickening (2 cm) through the gastric cardia and gastric body along the greater curvature (image 38/200001). Duodenum limited view of the bowel is unremarkable. Vascular/Lymphatic: Abdominal aortic normal caliber. No retroperitoneal adenopathy. Significant periportal adenopathy. On the coronal imaging T2 weighted series there is extensive omental thickening in the lesser and greater omentum (image 29/3001). Interestingly this does not appear to enhance abnormally on postcontrast imaging although exam is limited There is a large volume of intraperitoneal free fluid. Musculoskeletal: No aggressive osseous lesion IMPRESSION: 1.  Extensive omental thickening in the lesser and greater omentum. Recommend CT abdomen pelvis with contrast for further evaluation. Recommend cytology evaluation of ascitic fluid (thoracentesis performed 08/08/2017). 2. Large volume intraperitoneal free fluid. 3. Thickening of the gastric mucosa involving the greater curvature of the stomach. Recommend attention on above recommended CT and consider upper endoscopy if concern for inflammation or malignancy 4. Hepatic steatosis. No hepatoma identified. Portal veins patent. No biliary obstruction Electronically Signed   By: Genevive BiStewart  Edmunds M.D.   On: 08/09/2017 23:19       Assessment / Plan:   32 y/o male with history of alcohol use, who presented with worsening ascites / LE edema, jaundice, and severe hyponatremia. US showed changes concerning for cirrhosis with a small cystic lesion of the liver. MRI liver showed omental thickening, thickening of the gastric mucosa, hepatic steatosis, but smooth liver and no evidence of hepatoma. Recommended follow up CT scan which was done showing hepatic steatosis, ascites and changes suspicious for portal hypertension but not definitive cirrhosis. The spleen is normal. Omental edema noted secondary to portal hypertension but no omental mass. Some small bowel and colonic wall thickening also noted, likely related to low albumin.  Overall, I suspect he has alcoholic hepatitis with ascites with likely cirrhosis although imaging is not definitive for cirrhosis. Sending genetic testing to rule out hemochromatosis, but I suspect ferritin elevation is due to alcoholic hepatitis. Hyponatremia is resolving, now starting diuretics.   Recommend at this time: - start aldactone 100mg  / day, continue lasix 40mg  / day - low Na diet - complete alcohol abstinence - perhaps discharge later tomorrow if tolerates diuretics and labs stable. He has follow up scheduled with PCM on Tuesday - check hep A and hep B immunity, vaccinate if  needed - will need EGD as outpatient to screen for varices, may consider colonoscopy but suspect CT changes represent low protein state - will need close outpatient follow up after discharge to titrate diuretics and monitor labs - if the question remains about presence of cirrhosis moving forward, may consider liver biopsy down the road. I explained to him alcoholic hepatitis takes weeks / months to recover from, I suspect he does have cirrhosis but will see how he does with time.  Will reassess him tomorrow.  Ileene Patrick, MD [facility] Gastroenter[facility]

## 2017-08-10 NOTE — Progress Notes (Addendum)
PROGRESS NOTE    Vincent [name]  RUE:[id number] DOB: Sep 07, 1985 DOA: 08/06/2017 PCP: Thomasene Lotpalski, Deborah, DO   Brief Narrative: Vincent [name] is a 32 y.o. male with a history of ADHD, alcohol use. He presented with abdominal and lower extremity swelling found to have cirrhosis of the liver with associated ascites, in addition to severe hyponatremia. His hyponatremia has been treated with hypertonic saline and he is s/p paracentesis on 4/18 yielding 5 L of fluid.   Assessment & Plan:   Principal Problem:   Cirrhosis of liver with ascites (HCC) Active Problems:   ADHD (attention deficit hyperactivity disorder)   Hyponatremia   Coagulopathy (HCC)   Cirrhosis of the liver with ascites Likely alcoholic cirrhosis per GI assessment. S/p thoracentesis yielding 5L of fluid. Not consistent with SBP. MRI significant for omental thickening, gastric mucosal thickening, hepatic steatosis and ascites. Alkaline phosphatase/AST/bilirubin elevated and stable. -GI recommendations -CT abdomen/pelvis w/ contrast  Hyponatremia Hypervolemic. Improved s/p hypertonic saline and now lasix started with continued improvement. -Nephrology/GI recommendations  History of panic attacks Anxiety Takes Xanax prn as an outpatient. -Resume home Xanax  Coagulopathy INR 1.7 on admission. Down to 1.54. Secondary to liver disease.  Alcohol abuse Currently without withdrawal symptoms. Out of window -Discontinue CIWA  Hyponatremia Normal magnesium -Supplementation  Diminished lung sounds Likely related to effusion. CT abdomen ordered as mentioned above. No cough, fever, pain, leukocytosis to suggest pneumnoia   DVT prophylaxis: SCDs Code Status:   Code Status: Full Code Family Communication: None at bedside Disposition Plan: Discharge home pending resolution of hyponatremia and GI workup for cirrhosis   Consultants:   Gastroenterology  Nephrology  Procedures:   Paracentesis  (4/18)  Antimicrobials:  None    Subjective: Anxious.  Objective: Vitals:   08/09/17 0536 08/09/17 1504 08/10/17 0000 08/10/17 0623  BP: 120/71 (!) 129/92 128/89 106/77  Pulse: (!) 105 (!) 115 (!) 110 (!) 102  Resp: 18   18  Temp: 98.6 F (37 C) 97.9 F (36.6 C)  98.6 F (37 C)  TempSrc: Oral Oral  Oral  SpO2: 95% 98%  96%    Intake/Output Summary (Last 24 hours) at 08/10/2017 1025 Last data filed at 08/10/2017 [id number] Gross per 24 hour  Intake 240 ml  Output -  Net 240 ml   There were no vitals filed for this visit.  Examination:  General exam: Appears calm and comfortable Respiratory: Diminished in right lower lobe, otherwise clear. Unlabored work of breathing. No wheezing or rales. Cardiovascular system: Regular rate and rhythm. Normal S1 and S2. No heart murmurs present. No extra heart sounds Gastrointestinal system: Soft, non-tender, distended, no guarding, no rebound, no masses felt Central nervous system: alert, oriented, no focal deficits Extremities: Trace bilateral edema. No calf tenderness Skin: No cyanosis. No rashes Psychiatry: Judgement, insight, mood normal. Flat affect.    Data Reviewed: I have personally reviewed following labs and imaging studies  CBC: Recent Labs  Lab 08/06/17 1624 08/06/17 2208 08/06/17 2311 08/09/17 0344  WBC 16.2* 16.4*  --  9.4  NEUTROABS 12.7* 12.1*  --   --   HGB 14.9 14.7 15.3 12.5*  HCT 40.5 38.7* 45.0 34.9*  MCV 100* 95.1  --  98.3  PLT 386* 363  --  225   Basic Metabolic Panel: Recent Labs  Lab 08/09/17 0344 08/09/17 1159 08/09/17 1712 08/09/17 2317 08/10/17 0640  NA 123*  124* 125* 125* 128* 127*  K 3.1*  3.1* 3.4* 3.1* 3.3* 3.5  CL 89*  89* 89*  90* 91* 91*  CO2 27  27 26 25 29 25   GLUCOSE 103*  103* 95 136* 102* 88  BUN 5*  5* 5* <5* 6 7  CREATININE 0.96  0.94 1.06 1.01 1.07 1.12  CALCIUM 7.7*  7.7* 8.2* 7.9* 8.2* 8.2*  MG 1.8  --   --   --   --    GFR: Estimated Creatinine Clearance:  105.4 mL/min (by C-G formula based on SCr of 1.12 mg/dL). Liver Function Tests: Recent Labs  Lab 08/06/17 2208 08/07/17 0317 08/08/17 1434 08/09/17 0344 08/10/17 0640  AST 213* 177* 140* 121* 126*  ALT 72* 60 50 44 46  ALKPHOS 184* 154* 138* 124 131*  BILITOT 5.3* 4.8* 2.7* 2.1* 2.3*  PROT 6.1* 5.2* 5.0* 4.6* 5.3*  ALBUMIN 2.9* 2.4* 2.3* 2.1* 2.4*   Recent Labs  Lab 08/06/17 1624  LIPASE 86*  AMYLASE 58   Recent Labs  Lab 08/06/17 2208  AMMONIA 78*   Coagulation Profile: Recent Labs  Lab 08/06/17 1624 08/07/17 0047  INR 1.7* 1.54   Cardiac Enzymes: No results for input(s): CKTOTAL, CKMB, CKMBINDEX, TROPONINI in the last 168 hours. BNP (last 3 results) No results for input(s): PROBNP in the last 8760 hours. HbA1C: No results for input(s): HGBA1C in the last 72 hours. CBG: No results for input(s): GLUCAP in the last 168 hours. Lipid Profile: No results for input(s): CHOL, HDL, LDLCALC, TRIG, CHOLHDL, LDLDIRECT in the last 72 hours. Thyroid Function Tests: No results for input(s): TSH, T4TOTAL, FREET4, T3FREE, THYROIDAB in the last 72 hours. Anemia Panel: No results for input(s): VITAMINB12, FOLATE, FERRITIN, TIBC, IRON, RETICCTPCT in the last 72 hours. Sepsis Labs: No results for input(s): PROCALCITON, LATICACIDVEN in the last 168 hours.  Recent Results (from the past 240 hour(s))  MRSA PCR Screening     Status: None   Collection Time: 08/07/17 11:34 PM  Result Value Ref Range Status   MRSA by PCR NEGATIVE NEGATIVE Final    Comment:        The GeneXpert MRSA Assay (FDA approved for NASAL specimens only), is one component of a comprehensive MRSA colonization surveillance program. It is not intended to diagnose MRSA infection nor to guide or monitor treatment for MRSA infections. Performed at [facility], [address] [place], [place] [address]   Gram stain     Status: None   Collection Time: 08/08/17 12:30 PM  Result Value Ref Range Status    Specimen Description PERITONEAL FLUID  Final   Special Requests NONE  Final   Gram Stain   Final    WBC PRESENT, PREDOMINANTLY MONONUCLEAR NO ORGANISMS SEEN CYTOSPIN SMEAR Performed at [facility], [address], [place], [place] [address]    Report Status 08/08/2017 FINAL  Final  Culture, body fluid-bottle     Status: None (Preliminary result)   Collection Time: 08/08/17 12:31 PM  Result Value Ref Range Status   Specimen Description PERITONEAL FLUID  Final   Special Requests NONE  Final   Culture   Final    NO GROWTH 1 DAY Performed at [facility], [address] [place], [place] [address]    Report Status PENDING  Incomplete         Radiology Studies: Mr Liver W Wo Contrast  Result Date: 08/09/2017 CLINICAL DATA:  Ascites, cirrhosis EXAM: MRI ABDOMEN WITHOUT AND WITH CONTRAST TECHNIQUE: Multiplanar multisequence MR imaging of the abdomen was performed both before and after the administration of  intravenous contrast. CONTRAST:  20mL MULTIHANCE GADOBENATE DIMEGLUMINE 529 MG/ML IV SOLN COMPARISON:  Ultrasound 08/06/2017 FINDINGS: Lower chest:  Small effusion Hepatobiliary: Mild diffuse loss of signal intensity within the liver on opposed phase imaging consistent with hepatic steatosis. Liver has a smooth contour. The caudate lobe is slightly enlarged. On the early arterial phase imaging there is no focal hepatic lesion shows hepatoma. There is a benign cyst in the LEFT hepatic lobe. Portal veins are patent. No biliary distension.  Common bile duct normal Pancreas: Normal pancreatic parenchymal intensity. No ductal dilatation or inflammation. Spleen: Normal spleen. Adrenals/urinary tract: Adrenal glands and kidneys are normal. Stomach/Bowel: There is eccentric thickening (2 cm) through the gastric cardia and gastric body along the greater curvature (image 38/200001). Duodenum limited view of the bowel is unremarkable. Vascular/Lymphatic: Abdominal aortic normal caliber.  No retroperitoneal adenopathy. Significant periportal adenopathy. On the coronal imaging T2 weighted series there is extensive omental thickening in the lesser and greater omentum (image 29/3001). Interestingly this does not appear to enhance abnormally on postcontrast imaging although exam is limited There is a large volume of intraperitoneal free fluid. Musculoskeletal: No aggressive osseous lesion IMPRESSION: 1. Extensive omental thickening in the lesser and greater omentum. Recommend CT abdomen pelvis with contrast for further evaluation. Recommend cytology evaluation of ascitic fluid (thoracentesis performed 08/08/2017). 2. Large volume intraperitoneal free fluid. 3. Thickening of the gastric mucosa involving the greater curvature of the stomach. Recommend attention on above recommended CT and consider upper endoscopy if concern for inflammation or malignancy 4. Hepatic steatosis. No hepatoma identified. Portal veins patent. No biliary obstruction Electronically Signed   By: Genevive Bi M.D.   On: 08/09/2017 23:19   Ir Paracentesis  Result Date: 08/08/2017 INDICATION: Patient with cirrhosis of the liver and ascites. Request is made for diagnostic and therapeutic paracentesis. EXAM: ULTRASOUND GUIDED DIAGNOSTIC AND THERAPEUTIC PARACENTESIS MEDICATIONS: 10 mL 2% lidocaine COMPLICATIONS: None immediate. PROCEDURE: Informed written consent was obtained from the patient after a discussion of the risks, benefits and alternatives to treatment. A timeout was performed prior to the initiation of the procedure. Initial ultrasound scanning demonstrates a moderate amount of ascites within the right lower abdominal quadrant. The right lower abdomen was prepped and draped in the usual sterile fashion. 2% lidocaine was used for local anesthesia. Following this, a 19 gauge, 7-cm, Yueh catheter was introduced. An ultrasound image was saved for documentation purposes. The paracentesis was performed. The catheter was  removed and a dressing was applied. The patient tolerated the procedure well without immediate post procedural complication. FINDINGS: A total of approximately 5.0 liters of clear, yellow fluid was removed. Samples were sent to the laboratory as requested by the clinical team. IMPRESSION: Successful ultrasound-guided diagnostic and therapeutic paracentesis yielding 5.0 liters of peritoneal fluid. Read by: Loyce Dys PA-C Electronically Signed   By: Malachy Moan M.D.   On: 08/08/2017 12:56        Scheduled Meds: . folic acid  1 mg Oral Daily  . furosemide  40 mg Oral Daily  . multivitamin with minerals  1 tablet Oral Daily  . pantoprazole  40 mg Oral Daily  . thiamine  100 mg Oral Daily   Continuous Infusions:   LOS: 3 days     Jacquelin Hawking, MD [facility] 08/10/2017, 10:25 AM Pager: [phone]  If 7PM-7AM, please contact night-coverage [email or web address] Password [facility] 08/10/2017, 10:25 AM

## 2017-08-11 DIAGNOSIS — K7011 Alcoholic hepatitis with ascites: Principal | ICD-10-CM

## 2017-08-11 DIAGNOSIS — K7200 Acute and subacute hepatic failure without coma: Secondary | ICD-10-CM

## 2017-08-11 LAB — BASIC METABOLIC PANEL
Anion gap: 9 (ref 5–15)
BUN: 5 mg/dL — AB (ref 6–20)
CHLORIDE: 93 mmol/L — AB (ref 101–111)
CO2: 26 mmol/L (ref 22–32)
CREATININE: 1.05 mg/dL (ref 0.61–1.24)
Calcium: 7.9 mg/dL — ABNORMAL LOW (ref 8.9–10.3)
GFR calc Af Amer: >60 mL/min (ref >60)
GFR calc non Af Amer: >60 mL/min (ref >60)
Glucose, Bld: 97 mg/dL (ref 65–99)
Potassium: 3.6 mmol/L (ref 3.5–5.1)
Sodium: 128 mmol/L — ABNORMAL LOW (ref 135–145)

## 2017-08-11 MED ORDER — SPIRONOLACTONE 100 MG PO TABS
100.0000 mg | ORAL_TABLET | Freq: Every day | ORAL | Status: AC
  Administered 2017-08-11: 100 mg via ORAL
  Filled 2017-08-11: qty 1

## 2017-08-11 MED ORDER — THIAMINE HCL 100 MG PO TABS: 100.0000 mg | ORAL_TABLET | Freq: Every day | ORAL | 0 refills | Status: DC

## 2017-08-11 MED ORDER — SPIRONOLACTONE 100 MG PO TABS: 200.0000 mg | ORAL_TABLET | Freq: Every day | ORAL | 0 refills | Status: DC

## 2017-08-11 MED ORDER — FOLIC ACID 1 MG PO TABS: 1.0000 mg | ORAL_TABLET | Freq: Every day | ORAL | 0 refills | Status: DC

## 2017-08-11 MED ORDER — SPIRONOLACTONE 100 MG PO TABS: 200.0000 mg | ORAL_TABLET | Freq: Every day | ORAL | Status: DC

## 2017-08-11 MED ORDER — FUROSEMIDE 40 MG PO TABS: 40.0000 mg | ORAL_TABLET | Freq: Two times a day (BID) | ORAL | 0 refills | Status: DC

## 2017-08-11 MED ORDER — ADULT MULTIVITAMIN W/MINERALS CH: 1.0000 | ORAL_TABLET | Freq: Every day | ORAL | 0 refills | Status: AC

## 2017-08-11 MED ORDER — HYDROXYZINE HCL 50 MG PO TABS: 50.0000 mg | ORAL_TABLET | Freq: Four times a day (QID) | ORAL | 0 refills | Status: DC | PRN

## 2017-08-11 MED ORDER — FUROSEMIDE 40 MG PO TABS: 40.0000 mg | ORAL_TABLET | Freq: Two times a day (BID) | ORAL | Status: DC

## 2017-08-11 NOTE — Discharge Instructions (Signed)
Vincent [name],  You were admitted secondary to ascites and hyponatremia. You were found to have concern for cirrhosis, however, on imaging, this could not be confirmed. You have alcoholic hepatitis. You have been placed on a diet with sodium restriction of 2 grams per day. You have been started on lasix and spironolactone to help with the ascites. Your hyponatremia should improve further with the diuretic medication. You will need repeat lab work this upcoming week when you see your PCP. You will also need follow-up with the gastroenterologist as an outpatient to follow-up management of your liver. Please do not use Tylenol or any Tylenol containing medications as this could adversely affect your liver. It was a pleasure taking care of you.  Sincerely,  Jacquelin Hawkingalph Jing Howatt, MD [facility]

## 2017-08-11 NOTE — Discharge Summary (Signed)
Physician Discharge Summary  Quayshaun Hubbert YFV:[id number] DOB: Jul 29, 1985 DOA: 08/06/2017  PCP: Mellody Dance, DO  Admit date: 08/06/2017 Discharge date: 08/11/2017  Admitted From: Home Disposition: Home  Recommendations for Outpatient Follow-up:  1. Follow up with PCP in 2 days 2. Please obtain BMP/CBC in 2 days 3. 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  He went to his PCPs office today with 3 week history of abdominal distention, increased leg swelling that began today.  Decreased appetite.  Went to PCPs office.  [place] of abdomen shows cirrhosis, ascites, and suggests portal hypertension.  Lab work at PCPs office showed sodium of 112 at which point PCP called patient and sent him in to the ED.   ED Course: Sodium 108, Cl 68.  BUN 6, creat 1.3.  WBC 16.4.  AST 213, ALT 72, T bili 5.3, ammonia 78, albumin 2.9.  Alk phos 184, INR 1.5.    Hospital course:  Alcoholic hepatitis of the liver with ascites Likely alcoholic cirrhosis per GI assessment, however, imaging did not confirm diagnosis of cirrhosis. S/p thoracentesis yielding 5L of fluid. Not consistent with SBP. MRI significant for omental thickening, gastric mucosal thickening, hepatic steatosis and ascites. Alkaline phosphatase/AST/bilirubin elevated and stable. Ferritin level elevated. CT scan obtained secondary to concern of omental mass, which was significant for changes secondary to portal hypertension. GI to follow-up as an outpatient. Possible EGD as an outpatient. Lasix increased to 40 mg BID and spironolactone to 200 mg  daily.  Hyponatremia Hypervolemic. Improved s/p hypertonic saline and now lasix started with continued improvement. Asymptomatic on discharge. Lasix/spironolactone increased prior to discharge. Will need repeat BMP this week (has an appointment with PCP 08/13/17)  History of panic attacks Anxiety Takes Xanax prn as an outpatient. Started on Atarax. Recommend using prior to using Xanax and using Xanax as breakthrough.  Coagulopathy INR 1.7 on admission. Down to 1.54. Secondary to liver disease.  Alcohol abuse Mild symptoms initially. Resolved. Out of window  Hyponatremia Normal magnesium. Supplementation given. Will need recheck as an outpatient.  Diminished lung sounds CT scan showed elevated right hemidiaphragm. Mild pleural effusion. No evidence of pneumonia. Clinically no pneumonia.  Loose stools In setting of recent contrast. No abdominal pain. Afebrile. Low concern for infectious cause at this point. Did not receive antibiotics. Outpatient follow-up.     Discharge Diagnoses:  Principal Problem:   Alcoholic hepatitis with ascites Active Problems:   ADHD (attention deficit hyperactivity disorder)   Hyponatremia   Coagulopathy (HCC)   Decreased breath sounds at right lung base   Ascites   Liver failure without hepatic coma (HCC)   Hypokalemia   Anxiety    Discharge Instructions  Discharge Instructions    Call MD for:  difficulty breathing, headache or visual disturbances   Complete by:  As directed    Call MD for:  severe uncontrolled pain   Complete by:  As directed    Increase activity slowly   Complete by:  As directed      Allergies as of 08/11/2017      Reactions   Amoxicillin Rash      Medication List  TAKE these medications   ALPRAZolam 0.5 MG tablet Commonly known as:  XANAX Take 0.5 tablets (0.25 mg total) by mouth as needed (only for panic attacks).   folic acid 1 MG tablet Commonly known as:  FOLVITE Take 1 tablet (1 mg total) by  mouth daily. Start taking on:  08/12/2017   furosemide 40 MG tablet Commonly known as:  LASIX Take 1 tablet (40 mg total) by mouth 2 (two) times daily.   hydrOXYzine 50 MG tablet Commonly known as:  ATARAX/VISTARIL Take 1-2 tablets (50-100 mg total) by mouth every 6 (six) hours as needed for anxiety.   Melatonin 5 MG Tabs Take 5 mg by mouth at bedtime as needed (for sleep).   multivitamin with minerals Tabs tablet Take 1 tablet by mouth daily. Start taking on:  08/12/2017   omeprazole 20 MG capsule Commonly known as:  PRILOSEC Take 20 mg by mouth daily as needed (for acid reflux).   ondansetron 8 MG disintegrating tablet Commonly known as:  ZOFRAN-ODT Take 1 tablet (8 mg total) by mouth every 8 (eight) hours as needed for nausea.   spironolactone 100 MG tablet Commonly known as:  ALDACTONE Take 2 tablets (200 mg total) by mouth daily. Start taking on:  08/12/2017   thiamine 100 MG tablet Take 1 tablet (100 mg total) by mouth daily. Start taking on:  08/12/2017      Follow-up Information    Opalski, Neoma Laming, DO. Schedule an appointment as soon as possible for a visit in 2 day(s).   Specialty:  Family Medicine Why:  Hospital follow-up and BMP to recheck sodium and potassium Contact information: [address] [place] [address] [phone]          Allergies  Allergen Reactions  . Amoxicillin Rash    Consultations:  Gastroenterology  Nephrology   Procedures/Studies: US Abdomen Complete  Result Date: 08/06/2017 CLINICAL DATA:  32 year old male with a history of ascites and vomiting EXAM: ABDOMEN ULTRASOUND COMPLETE COMPARISON:  None. FINDINGS: Gallbladder: Gallbladder wall thickening. Negative sonographic Murphy sign. Minimal degree of dependently layered sludge/microlithiasis without posterior shadowing. Common bile duct: Diameter: 7 mm-8 mm Liver: Nodular contour of the liver with diffusely heterogeneous echotexture. Focal cystic lesion within the  right liver measures measures approximately 1.4 cm. Poor sonographic window, limiting evaluation of the main portal vein and right portal vein. The left portal vein is patent with hepatopetal flow. IVC: No abnormality visualized. Pancreas: Visualized portion unremarkable. Spleen: Greatest diameter of the spleen measures 7.8 cm Right Kidney: Length: 9.7 cm. Echogenicity within normal limits. No mass or hydronephrosis visualized. Left Kidney: Length: 10.9 cm. Echogenicity within normal limits. No mass or hydronephrosis visualized. Abdominal aorta: No aneurysm visualized. Other findings: Ascites IMPRESSION: Cirrhosis and ascites, suggesting portal hypertension. The portal venous system is poorly visualized on the current study, and if there is concern for further evaluation, contrast-enhanced CT or MRI would be suggested. Gallbladder wall thickening most likely related to chronic liver disease. Small amount of biliary sludge/microlithiasis without sonographic evidence of acute cholecystitis. Electronically Signed   By: Corrie Mckusick D.O.   On: 08/06/2017 19:01   Ct Abdomen Pelvis W Contrast  Result Date: 08/10/2017 CLINICAL DATA:  Abnormal thickened omentum on prior MRI. Clinical history on that exam of cirrhosis and ascites. EXAM: CT ABDOMEN AND PELVIS WITH CONTRAST TECHNIQUE: Multidetector CT imaging of the abdomen and pelvis was performed using the standard protocol following bolus administration of intravenous contrast. CONTRAST:  127m ISOVUE-300 IOPAMIDOL (ISOVUE-300) INJECTION 61% COMPARISON:  MRI of 1 day prior FINDINGS: Lower chest: Mild right hemidiaphragm elevation with minimal anterior right lung base volume loss. Subsegmental atelectasis at the left lung base and right lower lobe as well. Normal heart size with trace left pleural fluid. Hepatobiliary: Heterogeneous hepatic steatosis. more focal steatosis in the pericholecystic region of the liver. Subcentimeter left hepatic lobe cyst. Mild nonspecific  gallbladder mucosal hyperenhancement. No biliary duct dilatation. Pancreas: Normal, without mass or ductal dilatation. Spleen: Normal in size, without focal abnormality. Adrenals/Urinary Tract: Normal adrenal glands. Normal kidneys, without hydronephrosis. Stomach/Bowel: The greater curvature gastric wall thickening is no longer identified. Favor gastric contents along the lesser curvature of the stomach, including image 27/3. Portions of the colon are underdistended. Apparent wall thickening is at least partially felt to be secondary. Example image 46/3. Normal terminal ileum. Appendix not well visualized. Proximal and mid small bowel mild wall and fold thickening, including on image 50/3. Vascular/Lymphatic: Normal caliber of the aorta and branch vessels. Patent portal and hepatic veins. Borderline prominent and patent paraumbilical vein, including on image 44/3. No abdominopelvic adenopathy. Reproductive: Normal prostate. Other: Moderate volume abdominopelvic ascites. Omental edema, without well-defined omental or peritoneal mass. Musculoskeletal: No acute osseous abnormality. IMPRESSION: 1. Heterogeneous hepatic steatosis. Moderate volume abdominopelvic ascites and prominent paraumbilical vein. Favor cirrhosis and portal venous hypertension. 2. Omental edema, likely secondary to portal venous hypertension. No well-defined omental mass. 3. Small bowel wall/fold thickening. Concurrent colonic wall thickening, accentuated by underdistention. Findings are nonspecific and could simply be attributed to low albumin (decreased yesterday). Infectious enteritis and/or colitis cannot be excluded. Findings are new since yesterday's MRI. 4. Trace left pleural fluid. Electronically Signed   By: Abigail Miyamoto M.D.   On: 08/10/2017 14:08   Mr Liver W Wo Contrast  Result Date: 08/09/2017 CLINICAL DATA:  Ascites, cirrhosis EXAM: MRI ABDOMEN WITHOUT AND WITH CONTRAST TECHNIQUE: Multiplanar multisequence MR imaging of the  abdomen was performed both before and after the administration of intravenous contrast. CONTRAST:  73m MULTIHANCE GADOBENATE DIMEGLUMINE 529 MG/ML IV SOLN COMPARISON:  Ultrasound 08/06/2017 FINDINGS: Lower chest:  Small effusion Hepatobiliary: Mild diffuse loss of signal intensity within the liver on opposed phase imaging consistent with hepatic steatosis. Liver has a smooth contour. The caudate lobe is slightly enlarged. On the early arterial phase imaging there is no focal hepatic lesion shows hepatoma. There is a benign cyst in the LEFT hepatic lobe. Portal veins are patent. No biliary distension.  Common bile duct normal Pancreas: Normal pancreatic parenchymal intensity. No ductal dilatation or inflammation. Spleen: Normal spleen. Adrenals/urinary tract: Adrenal glands and kidneys are normal. Stomach/Bowel: There is eccentric thickening (2 cm) through the gastric cardia and gastric body along the greater curvature (image 38/200001). Duodenum limited view of the bowel is unremarkable. Vascular/Lymphatic: Abdominal aortic normal caliber. No retroperitoneal adenopathy. Significant periportal adenopathy. On the coronal imaging T2 weighted series there is extensive omental thickening in the lesser and greater omentum (image 29/3001). Interestingly this does not appear to enhance abnormally on postcontrast imaging although exam is limited There is a large volume of intraperitoneal free fluid. Musculoskeletal: No aggressive osseous lesion IMPRESSION: 1. Extensive omental thickening in the lesser and greater omentum. Recommend CT abdomen pelvis with contrast for further evaluation. Recommend cytology evaluation of ascitic fluid (thoracentesis performed 08/08/2017). 2. Large volume intraperitoneal free fluid. 3. Thickening of the gastric mucosa involving the greater curvature of the stomach. Recommend attention on above recommended CT and consider upper endoscopy if concern for inflammation or malignancy 4. Hepatic  steatosis.  No hepatoma identified. Portal veins patent. No biliary obstruction Electronically Signed   By: Suzy Bouchard M.D.   On: 08/09/2017 23:19   Dg Chest Port 1 View  Result Date: 08/06/2017 CLINICAL DATA:  Ascites EXAM: PORTABLE CHEST 1 VIEW COMPARISON:  None. FINDINGS: Normal cardiac silhouette. Low lung volumes. No edema, infiltrate or pneumothorax. Band of atelectasis at the LEFT lung base. IMPRESSION: No infiltrate or edema. Electronically Signed   By: Suzy Bouchard M.D.   On: 08/06/2017 22:05   Ir Paracentesis  Result Date: 08/08/2017 INDICATION: Patient with cirrhosis of the liver and ascites. Request is made for diagnostic and therapeutic paracentesis. EXAM: ULTRASOUND GUIDED DIAGNOSTIC AND THERAPEUTIC PARACENTESIS MEDICATIONS: 10 mL 2% lidocaine COMPLICATIONS: None immediate. PROCEDURE: Informed written consent was obtained from the patient after a discussion of the risks, benefits and alternatives to treatment. A timeout was performed prior to the initiation of the procedure. Initial ultrasound scanning demonstrates a moderate amount of ascites within the right lower abdominal quadrant. The right lower abdomen was prepped and draped in the usual sterile fashion. 2% lidocaine was used for local anesthesia. Following this, a 19 gauge, 7-cm, Yueh catheter was introduced. An ultrasound image was saved for documentation purposes. The paracentesis was performed. The catheter was removed and a dressing was applied. The patient tolerated the procedure well without immediate post procedural complication. FINDINGS: A total of approximately 5.0 liters of clear, yellow fluid was removed. Samples were sent to the laboratory as requested by the clinical team. IMPRESSION: Successful ultrasound-guided diagnostic and therapeutic paracentesis yielding 5.0 liters of peritoneal fluid. Read by: Brynda Greathouse PA-C Electronically Signed   By: Jacqulynn Cadet M.D.   On: 08/08/2017 12:56      Subjective: Loose stools today.  Discharge Exam: Vitals:   08/10/17 1512 08/11/17 0529  BP: 93/68 108/71  Pulse: (!) 102 (!) 105  Resp:  18  Temp: 98.6 F (37 C) 98.9 F (37.2 C)  SpO2: 100% 96%   Vitals:   08/10/17 0000 08/10/17 0623 08/10/17 1512 08/11/17 0529  BP: 128/89 106/77 93/68 108/71  Pulse: (!) 110 (!) 102 (!) 102 (!) 105  Resp:  18  18  Temp:  98.6 F (37 C) 98.6 F (37 C) 98.9 F (37.2 C)  TempSrc:  Oral Oral Oral  SpO2:  96% 100% 96%    General: Pt is alert, awake, not in acute distress Cardiovascular: RRR, S1/S2 +, no rubs, no gallops Respiratory: CTA bilaterally, no wheezing, no rhonchi Abdominal: Soft, NT, distended, bowel sounds + Extremities: no cyanosis    The results of significant diagnostics from this hospitalization (including imaging, microbiology, ancillary and laboratory) are listed below for reference.     Microbiology: Recent Results (from the past 240 hour(s))  MRSA PCR Screening     Status: None   Collection Time: 08/07/17 11:34 PM  Result Value Ref Range Status   MRSA by PCR NEGATIVE NEGATIVE Final    Comment:        The GeneXpert MRSA Assay (FDA approved for NASAL specimens only), is one component of a comprehensive MRSA colonization surveillance program. It is not intended to diagnose MRSA infection nor to guide or monitor treatment for MRSA infections. Performed at [facility], [place] [address], [place], [place] [address]   Gram stain     Status: None   Collection Time: 08/08/17 12:30 PM  Result Value Ref Range Status   Specimen Description PERITONEAL FLUID  Final   Special Requests NONE  Final  Gram Stain   Final    WBC PRESENT, PREDOMINANTLY MONONUCLEAR NO ORGANISMS SEEN CYTOSPIN SMEAR Performed at [facility], [place] [address], [place], [place] [address]    Report Status 08/08/2017 FINAL  Final  Culture, body fluid-bottle     Status: None (Preliminary result)   Collection Time: 08/08/17 12:31  PM  Result Value Ref Range Status   Specimen Description PERITONEAL FLUID  Final   Special Requests NONE  Final   Culture   Final    NO GROWTH 2 DAYS Performed at [facility] [address] [place], [place] [address]    Report Status PENDING  Incomplete     Labs: BNP (last 3 results) No results for input(s): BNP in the last 8760 hours. Basic Metabolic Panel: Recent Labs  Lab 08/09/17 0344  08/09/17 1712 08/09/17 2317 08/10/17 0640 08/10/17 1720 08/11/17 0603  NA 123*  124*   < > 125* 128* 127* 125* 128*  K 3.1*  3.1*   < > 3.1* 3.3* 3.5 3.8 3.6  CL 89*  89*   < > 90* 91* 91* 92* 93*  CO2 27  27   < > _0 GLUCOSE 103*  103*   < > 136* 102* 88 107* 97  BUN 5*  5*   < > <5* _1 5*  CREATININE 0.96  0.94   < > 1.01 1.07 1.12 1.13 1.05  CALCIUM 7.7*  7.7*   < > 7.9* 8.2* 8.2* 7.9* 7.9*  MG 1.8  --   --   --   --   --   --    < > = values in this interval not displayed.   Liver Function Tests: Recent Labs  Lab 08/06/17 2208 08/07/17 0317 08/08/17 1434 08/09/17 0344 08/10/17 0640  AST 213* 177* 140* 121* 126*  ALT 72* 60 50 44 46  ALKPHOS 184* 154* 138* 124 131*  BILITOT 5.3* 4.8* 2.7* 2.1* 2.3*  PROT 6.1* 5.2* 5.0* 4.6* 5.3*  ALBUMIN 2.9* 2.4* 2.3* 2.1* 2.4*   Recent Labs  Lab 08/06/17 1624  LIPASE 86*  AMYLASE 58   Recent Labs  Lab 08/06/17 2208  AMMONIA 78*   CBC: Recent Labs  Lab 08/06/17 1624 08/06/17 2208 08/06/17 2311 08/09/17 0344  WBC 16.2* 16.4*  --  9.4  NEUTROABS 12.7* 12.1*  --   --   HGB 14.9 14.7 15.3 12.5*  HCT 40.5 38.7* 45.0 34.9*  MCV 100* 95.1  --  98.3  PLT 386* 363  --  225   Cardiac Enzymes: No results for input(s): CKTOTAL, CKMB, CKMBINDEX, TROPONINI in the last 168 hours. BNP: Invalid input(s): POCBNP CBG: No results for input(s): GLUCAP in the last 168 hours. D-Dimer No results for input(s): DDIMER in the last 72 hours. Hgb A1c No results for input(s): HGBA1C in the last 72  hours. Lipid Profile No results for input(s): CHOL, HDL, LDLCALC, TRIG, CHOLHDL, LDLDIRECT in the last 72 hours. Thyroid function studies No results for input(s): TSH, T4TOTAL, T3FREE, THYROIDAB in the last 72 hours.  Invalid input(s): FREET3 Anemia work up No results for input(s): VITAMINB12, FOLATE, FERRITIN, TIBC, IRON, RETICCTPCT in the last 72 hours. Urinalysis    Component Value Date/Time   COLORURINE AMBER (A) 08/07/2017 0133   APPEARANCEUR CLEAR 08/07/2017 0133   LABSPEC 1.012 08/07/2017 0133   PHURINE 6.0 08/07/2017 0133   GLUCOSEU NEGATIVE 08/07/2017 0133   HGBUR NEGATIVE 08/07/2017 0133  BILIRUBINUR SMALL (A) 08/07/2017 0133   KETONESUR NEGATIVE 08/07/2017 0133   PROTEINUR NEGATIVE 08/07/2017 0133   NITRITE NEGATIVE 08/07/2017 0133   LEUKOCYTESUR NEGATIVE 08/07/2017 0133   Sepsis Labs Invalid input(s): PROCALCITONIN,  WBC,  LACTICIDVEN Microbiology Recent Results (from the past 240 hour(s))  MRSA PCR Screening     Status: None   Collection Time: 08/07/17 11:34 PM  Result Value Ref Range Status   MRSA by PCR NEGATIVE NEGATIVE Final    Comment:        The GeneXpert MRSA Assay (FDA approved for NASAL specimens only), is one component of a comprehensive MRSA colonization surveillance program. It is not intended to diagnose MRSA infection nor to guide or monitor treatment for MRSA infections. Performed at [facility], [place] [address] [place], [place] [address]   Gram stain     Status: None   Collection Time: 08/08/17 12:30 PM  Result Value Ref Range Status   Specimen Description PERITONEAL FLUID  Final   Special Requests NONE  Final   Gram Stain   Final    WBC PRESENT, PREDOMINANTLY MONONUCLEAR NO ORGANISMS SEEN CYTOSPIN SMEAR Performed at [facility], [place] [address], [place], [place] [address]    Report Status 08/08/2017 FINAL  Final  Culture, body fluid-bottle     Status: None (Preliminary result)   Collection Time: 08/08/17 12:31 PM   Result Value Ref Range Status   Specimen Description PERITONEAL FLUID  Final   Special Requests NONE  Final   Culture   Final    NO GROWTH 2 DAYS Performed at [facility] [address] [place], [place] [address]    Report Status PENDING  Incomplete     SIGNED:   Cordelia Poche, MD [facility] 08/11/2017, 2:48 PM Pager [phone]  If 7PM-7AM, please contact night-coverage [email or web address] Password [email or web address]

## 2017-08-11 NOTE — Progress Notes (Signed)
[facility] Progress Note   Chief Complaint:   New ascites   SUBJECTIVE:    feels okay, doesn't think diuretic doing much as urine output low.    ASSESSMENT AND PLAN:   251. 32 year old male with ETOH use / overuse who presented with ascites / edema, abnormal liver chemistries and severe hyponatremia. U/S suggested cirrhosis. CT scan suggested steatosis and possible cirrhosis / portal htn .  He had an MRI yesterday and it didn't show cirrhosis. He could just have acute portal HTN from ETOH in absence of cirrhosis but picture not yet clear.  -continue low sodium diet -Na 128 today. Patient doesn't feel he is diuresing. Will increase lasix to 40 mg BID and aldactone to 200 mg but he needs BMET Tuesday (sees PCP then).  -long discussion about ETOH abstinence including meds to help with cravings as well as outpatient programs such as AA.   -Our office will call him Monday with a follow up appt. He know to call us in interim for problems / questions.  -Needs eventual EGD to screen for esophageal varices, will schedule this when I see him in the office.   2. Cystic lesion right liver cyst on ultrasound - MRI shows cyst in LEFT lobe. No focal liver lesions.   3. Loose stool, not watery. CT scan remarkable for small bowel wall thickening / colonic thickening but findings probably related to underdistention / ascites / decreased albumin.  Suspect loose stool secondary to oral contrast for CT scan but if continues then will need infection workup.   OBJECTIVE:     Vital signs in last 24 hours: Temp:  [98.6 F (37 C)-98.9 F (37.2 C)] 98.9 F (37.2 C) (04/21 0529) Pulse Rate:  [102-105] 105 (04/21 0529) Resp:  [18] 18 (04/21 0529) BP: (93-108)/(68-71) 108/71 (04/21 0529) SpO2:  [96 %-100 %] 96 % (04/21 0529) Last BM Date: 08/09/17 General:   Alert, well-developed white male  in NAD EENT:  Normal hearing, non icteric sclera, conjunctive pink.  Heart:  Regular rate and  rhythm; no murmurs Pulm: Normal respiratory effort Abdomen:  Soft, moderately distended, nontender.  Normal bowel sounds, no masses felt. No hepatomegaly.    Neurologic:  Alert and  oriented x4;  grossly normal neurologically. Psych:  Pleasant, cooperative.  Normal mood and affect.   Intake/Output from previous day: 04/20 0701 - 04/21 0700 In: -  Out: 880 [Urine:880] Intake/Output this shift: No intake/output data recorded.  Lab Results: Recent Labs    08/09/17 0344  WBC 9.4  HGB 12.5*  HCT 34.9*  PLT 225   BMET Recent Labs    08/10/17 0640 08/10/17 1720 08/11/17 0603  NA 127* 125* 128*  K 3.5 3.8 3.6  CL 91* 92* 93*  CO2 25 24 26   GLUCOSE 88 107* 97  BUN 7 6 5*  CREATININE 1.12 1.13 1.05  CALCIUM 8.2* 7.9* 7.9*   LFT Recent Labs    08/10/17 0640  PROT 5.3*  ALBUMIN 2.4*  AST 126*  ALT 46  ALKPHOS 131*  BILITOT 2.3*  BILIDIR 1.0*  IBILI 1.3*    Ct Abdomen Pelvis W Contrast  Result Date: 08/10/2017 CLINICAL DATA:  Abnormal thickened omentum on prior MRI. Clinical history on that exam of cirrhosis and ascites. EXAM: CT ABDOMEN AND PELVIS WITH CONTRAST TECHNIQUE: Multidetector CT imaging of the abdomen and pelvis was performed using the standard protocol following bolus administration of intravenous contrast. CONTRAST:  100mL ISOVUE-300 IOPAMIDOL (ISOVUE-300) INJECTION  61% COMPARISON:  MRI of 1 day prior FINDINGS: Lower chest: Mild right hemidiaphragm elevation with minimal anterior right lung base volume loss. Subsegmental atelectasis at the left lung base and right lower lobe as well. Normal heart size with trace left pleural fluid. Hepatobiliary: Heterogeneous hepatic steatosis. more focal steatosis in the pericholecystic region of the liver. Subcentimeter left hepatic lobe cyst. Mild nonspecific gallbladder mucosal hyperenhancement. No biliary duct dilatation. Pancreas: Normal, without mass or ductal dilatation. Spleen: Normal in size, without focal abnormality.  Adrenals/Urinary Tract: Normal adrenal glands. Normal kidneys, without hydronephrosis. Stomach/Bowel: The greater curvature gastric wall thickening is no longer identified. Favor gastric contents along the lesser curvature of the stomach, including image 27/3. Portions of the colon are underdistended. Apparent wall thickening is at least partially felt to be secondary. Example image 46/3. Normal terminal ileum. Appendix not well visualized. Proximal and mid small bowel mild wall and fold thickening, including on image 50/3. Vascular/Lymphatic: Normal caliber of the aorta and branch vessels. Patent portal and hepatic veins. Borderline prominent and patent paraumbilical vein, including on image 44/3. No abdominopelvic adenopathy. Reproductive: Normal prostate. Other: Moderate volume abdominopelvic ascites. Omental edema, without well-defined omental or peritoneal mass. Musculoskeletal: No acute osseous abnormality. IMPRESSION: 1. Heterogeneous hepatic steatosis. Moderate volume abdominopelvic ascites and prominent paraumbilical vein. Favor cirrhosis and portal venous hypertension. 2. Omental edema, likely secondary to portal venous hypertension. No well-defined omental mass. 3. Small bowel wall/fold thickening. Concurrent colonic wall thickening, accentuated by underdistention. Findings are nonspecific and could simply be attributed to low albumin (decreased yesterday). Infectious enteritis and/or colitis cannot be excluded. Findings are new since yesterday's MRI. 4. Trace left pleural fluid. Electronically Signed   By: Jeronimo Greaves M.D.   On: 08/10/2017 14:08   Mr Liver W Wo Contrast  Result Date: 08/09/2017 CLINICAL DATA:  Ascites, cirrhosis EXAM: MRI ABDOMEN WITHOUT AND WITH CONTRAST TECHNIQUE: Multiplanar multisequence MR imaging of the abdomen was performed both before and after the administration of intravenous contrast. CONTRAST:  20mL MULTIHANCE GADOBENATE DIMEGLUMINE 529 MG/ML IV SOLN COMPARISON:   Ultrasound 08/06/2017 FINDINGS: Lower chest:  Small effusion Hepatobiliary: Mild diffuse loss of signal intensity within the liver on opposed phase imaging consistent with hepatic steatosis. Liver has a smooth contour. The caudate lobe is slightly enlarged. On the early arterial phase imaging there is no focal hepatic lesion shows hepatoma. There is a benign cyst in the LEFT hepatic lobe. Portal veins are patent. No biliary distension.  Common bile duct normal Pancreas: Normal pancreatic parenchymal intensity. No ductal dilatation or inflammation. Spleen: Normal spleen. Adrenals/urinary tract: Adrenal glands and kidneys are normal. Stomach/Bowel: There is eccentric thickening (2 cm) through the gastric cardia and gastric body along the greater curvature (image 38/200001). Duodenum limited view of the bowel is unremarkable. Vascular/Lymphatic: Abdominal aortic normal caliber. No retroperitoneal adenopathy. Significant periportal adenopathy. On the coronal imaging T2 weighted series there is extensive omental thickening in the lesser and greater omentum (image 29/3001). Interestingly this does not appear to enhance abnormally on postcontrast imaging although exam is limited There is a large volume of intraperitoneal free fluid. Musculoskeletal: No aggressive osseous lesion IMPRESSION: 1. Extensive omental thickening in the lesser and greater omentum. Recommend CT abdomen pelvis with contrast for further evaluation. Recommend cytology evaluation of ascitic fluid (thoracentesis performed 08/08/2017). 2. Large volume intraperitoneal free fluid. 3. Thickening of the gastric mucosa involving the greater curvature of the stomach. Recommend attention on above recommended CT and consider upper endoscopy if concern for inflammation or malignancy 4.  Hepatic steatosis. No hepatoma identified. Portal veins patent. No biliary obstruction Electronically Signed   By: Genevive Bi M.D.   On: 08/09/2017 23:19     Discharge  Planning Diet:  2 grams sodium Anticoagulation and antiplatelets:NA Discharge Medications: Aldactone 200 mg daily / lasix 40 mg BID Follow up: our office will call him with an appt. He sees his PCP on Tuesday and should get BMET at that time given diuretic increase.    Principal Problem:   Alcoholic hepatitis with ascites Active Problems:   ADHD (attention deficit hyperactivity disorder)   Hyponatremia   Cirrhosis of liver with ascites (HCC)   Coagulopathy (HCC)   Decreased breath sounds at right lung base   Ascites   Liver failure without hepatic coma (HCC)   Hypokalemia   Anxiety     LOS: 4 days   Willette Cluster ,NP 08/11/2017, 11:36 AM  Pager number [phone]

## 2017-08-11 NOTE — Progress Notes (Signed)
Hadley PenBrian Marquina to be D/C'd  per MD order. Discussed with the patient and all questions fully answered.  VSS, Skin clean, dry and intact without evidence of skin break down, no evidence of skin tears noted.  IV catheter discontinued intact. Site without signs and symptoms of complications. Dressing and pressure applied.  An After Visit Summary was printed and given to the patient. Patient informed where to pick up prescription.  D/c education completed with patient/family including follow up instructions, medication list, d/c activities limitations if indicated, with other d/c instructions as indicated by MD - patient able to verbalize understanding, all questions fully answered.   Patient instructed to return to ED, call 911, or call MD for any changes in condition.   Patient D/C home via private auto.

## 2017-08-12 ENCOUNTER — Telehealth: Payer: Self-pay

## 2017-08-12 LAB — HEPATITIS A ANTIBODY, TOTAL: Hep A Total Ab: NEGATIVE

## 2017-08-12 LAB — HEPATITIS B SURFACE ANTIBODY,QUALITATIVE: HEP B S AB: REACTIVE

## 2017-08-12 NOTE — Telephone Encounter (Signed)
-----   Message from Meredith PelPaula M Guenther, NP sent at 08/11/2017 12:30 PM EDT ----- Waynetta SandyBeth, please get this patient an appt with me in next couple of weeks. This will be a hospital follow. 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 Thanks

## 2017-08-12 NOTE — Telephone Encounter (Signed)
Left a message to call back for an appointment with Willette ClusterPaula Guenther, NP.

## 2017-08-13 ENCOUNTER — Ambulatory Visit (INDEPENDENT_AMBULATORY_CARE_PROVIDER_SITE_OTHER): Payer: PRIVATE HEALTH INSURANCE | Admitting: Family Medicine

## 2017-08-13 ENCOUNTER — Encounter: Payer: Self-pay | Admitting: Family Medicine

## 2017-08-13 VITALS — BP 134/83 | HR 119 | Ht 68.0 in | Wt 201.9 lb

## 2017-08-13 DIAGNOSIS — D689 Coagulation defect, unspecified: Secondary | ICD-10-CM | POA: Diagnosis not present

## 2017-08-13 DIAGNOSIS — E871 Hypo-osmolality and hyponatremia: Secondary | ICD-10-CM | POA: Diagnosis not present

## 2017-08-13 DIAGNOSIS — K7200 Acute and subacute hepatic failure without coma: Secondary | ICD-10-CM

## 2017-08-13 DIAGNOSIS — F902 Attention-deficit hyperactivity disorder, combined type: Secondary | ICD-10-CM

## 2017-08-13 DIAGNOSIS — K7011 Alcoholic hepatitis with ascites: Secondary | ICD-10-CM | POA: Diagnosis not present

## 2017-08-13 DIAGNOSIS — K7030 Alcoholic cirrhosis of liver without ascites: Secondary | ICD-10-CM | POA: Diagnosis not present

## 2017-08-13 DIAGNOSIS — F419 Anxiety disorder, unspecified: Secondary | ICD-10-CM

## 2017-08-13 DIAGNOSIS — G4700 Insomnia, unspecified: Secondary | ICD-10-CM | POA: Diagnosis not present

## 2017-08-13 DIAGNOSIS — E876 Hypokalemia: Secondary | ICD-10-CM

## 2017-08-13 DIAGNOSIS — K219 Gastro-esophageal reflux disease without esophagitis: Secondary | ICD-10-CM | POA: Diagnosis not present

## 2017-08-13 MED ORDER — FUROSEMIDE 40 MG PO TABS: 40.0000 mg | ORAL_TABLET | Freq: Two times a day (BID) | ORAL | 1 refills | Status: DC

## 2017-08-13 MED ORDER — FOLIC ACID 1 MG PO TABS: 1.0000 mg | ORAL_TABLET | Freq: Every day | ORAL | 1 refills | Status: DC

## 2017-08-13 MED ORDER — HYDROXYZINE HCL 50 MG PO TABS: 50.0000 mg | ORAL_TABLET | Freq: Four times a day (QID) | ORAL | 1 refills | Status: DC | PRN

## 2017-08-13 MED ORDER — OMEPRAZOLE 20 MG PO CPDR: 20.0000 mg | DELAYED_RELEASE_CAPSULE | Freq: Every day | ORAL | 1 refills | Status: DC

## 2017-08-13 MED ORDER — SPIRONOLACTONE 100 MG PO TABS: 200.0000 mg | ORAL_TABLET | Freq: Every day | ORAL | 1 refills | Status: DC

## 2017-08-13 MED ORDER — BUPROPION HCL ER (XL) 150 MG PO TB24: 150.0000 mg | ORAL_TABLET | ORAL | 0 refills | Status: DC

## 2017-08-13 MED ORDER — METHYLPHENIDATE HCL 10 MG PO TABS: ORAL_TABLET | ORAL | 0 refills | Status: DC

## 2017-08-13 MED ORDER — THIAMINE HCL 100 MG PO TABS: 100.0000 mg | ORAL_TABLET | Freq: Every day | ORAL | 1 refills | Status: DC

## 2017-08-13 NOTE — Progress Notes (Signed)
Impression and Recommendations:    1. Alcoholic cirrhosis, unspecified whether ascites present (HCC)   2. Subacute liver failure without hepatic coma   3. Alcoholic hepatitis with ascites   4. Attention deficit hyperactivity disorder (ADHD), combined type   5. Coagulopathy (HCC)   6. Hyponatremia   7. Hypokalemia   8. Anxiety   9. Gastroesophageal reflux disease, esophagitis presence not specified   10. Insomnia, unspecified type     1. Hospital Follow-Up - Blood work drawn today to check CMP (liver enzymes) and CBC.  - Encouraged patient to be sure to schedule follow up with Dr. Adela LankArmbruster next week or the week after.  - Advised patient to take action to stay sober and avoid alcohol.  Strongly emphasized the importance of therapy and social support.  - STRONGLY encouraged that the patient seek therapy and counseling.  - Recommended that the ptaient avoid use of Xanax or other liver metabolites while his liver is recuperating from acute event.  - Continue on medications as prescribed from hospital discharge (Lasix, spironolactone).  2. ADHD - Wellbutrin recommended and started today for improvement of mood and ADHD. - Risks and benefits of medication reviewed with the patient, and he agrees to begin.  - Ritalin recommended and started today.  Risks and benefits of medication reviewed with the patient, and he agrees to begin. - Per patient, Ritalin short-acting was the only one that worked for him in the past. Long-acting ritalin didn't work.  Adderall and other drugs tried in the past made him stay up for days on end.  3. General Health Maintenance  Insomnia - Vistaril refilled today for sleep aid.  GERD - Continue Prilosec daily.  Exercise & Dietary Habits - Encouraged the importance of self-care: taking medicines regularly, obtaining regular exercise and managing his stress.  - Advised patient to continue working toward exercising to improve mood, relieve  stress, and improve general health.  Recommended that the patient eventually strive for at least 150 minutes of moderate cardiovascular activity per week according to guidelines established by the [facility].   - Healthy dietary habits encouraged, including low-carb, and high amounts of lean protein in diet.   - Patient should also consume adequate amounts of water - half of body weight in oz of water per day.  4. Follow-Up - Will follow him closely to monitor his kidney function while he is diuresing. - Return in 4 weeks for follow-up OV.   Orders Placed This Encounter  Procedures  . Comprehensive metabolic panel  . CBC with Differential/Platelet  . Magnesium    Meds ordered this encounter  Medications  . buPROPion (WELLBUTRIN XL) 150 MG 24 hr tablet    Sig: Take 1 tablet (150 mg total) by mouth every morning.    Dispense:  90 tablet    Refill:  0  . methylphenidate (RITALIN) 10 MG tablet    Sig: One tab in the a.m. and 1 q. afternoon    Dispense:  180 tablet    Refill:  0  . folic acid (FOLVITE) 1 MG tablet    Sig: Take 1 tablet (1 mg total) by mouth daily.    Dispense:  90 tablet    Refill:  1  . furosemide (LASIX) 40 MG tablet    Sig: Take 1 tablet (40 mg total) by mouth 2 (two) times daily.    Dispense:  180 tablet    Refill:  1  . hydrOXYzine (ATARAX/VISTARIL) 50 MG tablet  Sig: Take 1-2 tablets (50-100 mg total) by mouth every 6 (six) hours as needed for anxiety.    Dispense:  90 tablet    Refill:  1  . omeprazole (PRILOSEC) 20 MG capsule    Sig: Take 1 capsule (20 mg total) by mouth daily.    Dispense:  90 capsule    Refill:  1  . spironolactone (ALDACTONE) 100 MG tablet    Sig: Take 2 tablets (200 mg total) by mouth daily.    Dispense:  180 tablet    Refill:  1  . thiamine 100 MG tablet    Sig: Take 1 tablet (100 mg total) by mouth daily.    Dispense:  90 tablet    Refill:  1    Gross side effects, risk and benefits, and alternatives of medications and  treatment plan in general discussed with patient.  Patient is aware that all medications have potential side effects and we are unable to predict every side effect or drug-drug interaction that may occur.   Patient will call with any questions prior to using medication if they have concerns.  Expresses verbal understanding and consents to current therapy and treatment regimen.  No barriers to understanding were identified.  Red flag symptoms and signs discussed in detail.  Patient expressed understanding regarding what to do in case of emergency\urgent symptoms  Please see AVS handed out to patient at the end of our visit for further patient instructions/ counseling done pertaining to today's office visit.   Return in about 1 month (around 09/10/2017).    Note: This note was prepared with assistance of Dragon voice recognition software. Occasional wrong-word or sound-a-like substitutions may have occurred due to the inherent limitations of voice recognition software.   This document serves as a record of services personally performed by [name], DO. It was created on her behalf by Peggye Fothergill, a trained medical scribe. The creation of this record is based on the scribe's personal observations and the provider's statements to them.   I have reviewed the above medical documentation for accuracy and completeness and I concur.  [name] 08/20/17 2:59 PM   -------------------------------------------------------------------------------------    Subjective:    HPI: Vincent [name] is a 32 y.o. male who presents to [facility] today for issues as discussed below.  Overall, patient is very closed off and withdrawn.  He tends to avoid engaging in open discussion during his appointment today.  Notes that he has only been home from the hospital for one full day; went home Easter night (1.5 days ago).  Notes that his belly has been more distended again  over the last day or two.  However, patient feels that he currently looks normal when he views himself in the mirror.  Overall, he feels better.  Denies nausea, vomiting, or GI distress.  Patient notes that he has never had success with therapy/counseling of any type in the past because he does not feel comfortable sharing his feelings with strangers.  Continues taking Lasix 40 mg twice daily, but does not feel that it is very effective.  Was prescribed Vistaril for sleep.  Notes that this has been working and would like a refill today.  Has not been taking Xanax.   Wt Readings from Last 3 Encounters:  08/13/17 201 lb 14.4 oz (91.6 kg)  08/06/17 211 lb 8 oz (95.9 kg)   BP Readings from Last 3 Encounters:  08/13/17 134/83  08/11/17 108/71  08/06/17 138/86   Pulse Readings from Last 3 Encounters:  08/13/17 (!) 119  08/11/17 (!) 105  08/06/17 (!) 115   BMI Readings from Last 3 Encounters:  08/13/17 30.70 kg/m  08/06/17 32.16 kg/m     Patient Care Team    Relationship Specialty Notifications Start End  [name], DO PCP - General Family Medicine  08/06/17      Patient Active Problem List   Diagnosis Date Noted  . Decreased breath sounds at right lung base   . Ascites   . Liver failure without hepatic coma (HCC)   . Hypokalemia   . Anxiety   . Alcoholic hepatitis with ascites   . Coagulopathy (HCC) 08/08/2017  . Hyponatremia 08/07/2017  . ADHD (attention deficit hyperactivity disorder) 08/06/2017    Past Medical history, Surgical history, Family history, Social history, Allergies and Medications have been entered into the medical record, reviewed and changed as needed.    Current Meds  Medication Sig  . ALPRAZolam (XANAX) 0.5 MG tablet Take 0.5 tablets (0.25 mg total) by mouth as needed (only for panic attacks).  . folic acid (FOLVITE) 1 MG tablet Take 1 tablet (1 mg total) by mouth daily.  . furosemide (LASIX) 40 MG tablet Take 1 tablet (40 mg total) by  mouth 2 (two) times daily.  . hydrOXYzine (ATARAX/VISTARIL) 50 MG tablet Take 1-2 tablets (50-100 mg total) by mouth every 6 (six) hours as needed for anxiety.  . Melatonin 5 MG TABS Take 5 mg by mouth at bedtime as needed (for sleep).  . Multiple Vitamin (MULTIVITAMIN WITH MINERALS) TABS tablet Take 1 tablet by mouth daily.  Marland Kitchen omeprazole (PRILOSEC) 20 MG capsule Take 1 capsule (20 mg total) by mouth daily.  . ondansetron (ZOFRAN-ODT) 8 MG disintegrating tablet Take 1 tablet (8 mg total) by mouth every 8 (eight) hours as needed for nausea.  Marland Kitchen spironolactone (ALDACTONE) 100 MG tablet Take 2 tablets (200 mg total) by mouth daily.  Marland Kitchen thiamine 100 MG tablet Take 1 tablet (100 mg total) by mouth daily.  . [DISCONTINUED] folic acid (FOLVITE) 1 MG tablet Take 1 tablet (1 mg total) by mouth daily.  . [DISCONTINUED] furosemide (LASIX) 40 MG tablet Take 1 tablet (40 mg total) by mouth 2 (two) times daily.  . [DISCONTINUED] hydrOXYzine (ATARAX/VISTARIL) 50 MG tablet Take 1-2 tablets (50-100 mg total) by mouth every 6 (six) hours as needed for anxiety.  . [DISCONTINUED] omeprazole (PRILOSEC) 20 MG capsule Take 20 mg by mouth daily as needed (for acid reflux).   . [DISCONTINUED] spironolactone (ALDACTONE) 100 MG tablet Take 2 tablets (200 mg total) by mouth daily.  . [DISCONTINUED] thiamine 100 MG tablet Take 1 tablet (100 mg total) by mouth daily.    Allergies:  Allergies  Allergen Reactions  . Amoxicillin Rash     Review of Systems:  A fourteen system review of systems was performed and found to be positive as per HPI.   Objective:   Blood pressure 134/83, pulse (!) 119, height 5\' 8"  (1.727 m), weight 201 lb 14.4 oz (91.6 kg), SpO2 98 %. Body mass index is 30.7 kg/m. General: Well Developed, well nourished, and in no acute distress.  Neuro: Alert and oriented x3, extra-ocular muscles intact, sensation grossly intact.  HEENT:[place]/AT, PERRLA, neck supple, No carotid bruits; no JVD appreciated;  sclera B/l slightly yellowish tinge Cardiac: Regular rhythm, slightly tacky, hyperdynamic. No m or rub Respiratory: Essentially clear to auscultation bilaterally.  No crackles or bibasilar rales.  Not using  accessory muscles, speaking in full sentences.  Abdomen abd distention with fluid shift, tympany present; spider angiomas due to venous congestion present, umbilicus distended but not herniated Musculoskeletal: Ambulates w/o diff, FROM * 4 ext. No liver flap or hyperreflexia present Vasc: less 2 sec cap RF, warm and pink;  Severe pitting edema entire b/l l ext into proximal thighs, taught skin Psych:  No HI/SI, judgement and insight good, Somnolent-appearing.  Bilateral LE Edema: Pitting edema bilateral LE into proximal thighs bilaterally.

## 2017-08-13 NOTE — Patient Instructions (Addendum)
We are still waiting on your hemochromatosis lab work to be resulted.   -Please call the GI office-Dr. Armbruster's for a follow-up office visit next week.   -Please follow-up with me in 4 weeks to see how you are doing on the Wellbutrin as well as the Ritalin.         Behavioral Health/ Counseling Referrals    Dr. Marvene Staffarryl Hyers, PHD Dr. Marvene Staffarryl Hyers, PHD is a counselor in [place], [place].  [address] [place], [place] [address] Contact Information [phone]   Francee NodalJo Heather Dodson, [place]  [phone] [email or web address] ( she does [place]ian and faith-based coaching and counseling )   [facility]- ( faith-based counseling ) Address: [address] [place], [place] [address] [phone] Office Extension 100 for appointments [phone] Fax Hours: Monday - Thursday 8:00am-6:00pm Closed for lunch 12-1Thursday only Friday: Closed all day   [facility] Hurley CiscoBARBARA FOUSEK, LCSW, ACSW, M.ED.  -Hurley CiscoBarbara Fousek is a licensed clinical social worker in practice over 35 years and with Dr. Milagros Evenerupinder Kaur for the last 10 years.  -She sees adults, adolescents, children & families and couples. -Services are provided for mood and anxiety disorders, marital issues, family or parent/child problems, parenting, co-dependency, gender issues, trauma, grief, and stages of life issues. She also provides critical incident stress debriefing.  -Britta MccreedyBarbara accepts many employee assistance programs (EAP), [employer] Schwabcommercial insurance, ChiropractorMedicare and Tricare.  PHONE  [phone]                FAX [phone]   Bethann BerkshireScott Young [email or web address] [employer]- gen counseling;  PHD   Corine ShelterResa McKinney, MSW [address]  [place] [phone]   [facility] Caralyn Guileavid Gutterman, PhD [address], [place] [phone]   [facility]- children [address], [place] [phone]   Heloise BeechamAlicia Brown-Licensed Professional Counselor Counseling and [employer] [phone]   [facility] MacKenzie-Substance abuse [facility]wn Taylor-Clinical Manager [address], [place] [phone]   [facility] [address], [place] [phone] Eliott NineMichie Dew, PhD [place] ScrapeStuart Hunt, PhD Hollace Kinnierlair Huprich, LCSW Andrena MewsJennifer Sommer, PhD-child, adolescent and adults   [facility] [address], [place] [phone] Daun PeacockSara DeHart-Young, MS-child, adolescent and adults Madelaine EtienneKatherine Glenn, PhD-adolescent and adults   [facility]-grief, terminal illness [address], [place] [phone]   [facility] [address], [place] [phone] [facility] [address] [place] [phone]   [facility] [address], [place] [phone]   [facility] [address], [address], [place] [phone]   [facility] [address], [place] [phone]   [facility] [address], [place] [phone]   [facility] [address], [address], [place] [phone]

## 2017-08-14 LAB — COMPREHENSIVE METABOLIC PANEL
ALBUMIN: 2.8 g/dL — AB (ref 3.5–5.5)
ALT: 36 IU/L (ref 0–44)
AST: 86 IU/L — ABNORMAL HIGH (ref 0–40)
Albumin/Globulin Ratio: 1.2 (ref 1.2–2.2)
Alkaline Phosphatase: 113 IU/L (ref 39–117)
BUN / CREAT RATIO: 10 (ref 9–20)
BUN: 11 mg/dL (ref 6–20)
Bilirubin Total: 1.4 mg/dL — ABNORMAL HIGH (ref 0.0–1.2)
CALCIUM: 8.1 mg/dL — AB (ref 8.7–10.2)
CO2: 25 mmol/L (ref 20–29)
CREATININE: 1.1 mg/dL (ref 0.76–1.27)
Chloride: 94 mmol/L — ABNORMAL LOW (ref 96–106)
GFR, EST AFRICAN AMERICAN: 101 mL/min/{1.73_m2} (ref >59)
GFR, EST NON AFRICAN AMERICAN: 88 mL/min/{1.73_m2} (ref >59)
GLOBULIN, TOTAL: 2.4 g/dL (ref 1.5–4.5)
Glucose: 110 mg/dL — ABNORMAL HIGH (ref 65–99)
Potassium: 4 mmol/L (ref 3.5–5.2)
Sodium: 131 mmol/L — ABNORMAL LOW (ref 134–144)
TOTAL PROTEIN: 5.2 g/dL — AB (ref 6.0–8.5)

## 2017-08-14 LAB — CBC WITH DIFFERENTIAL/PLATELET
BASOS: 1 %
Basophils Absolute: 0.1 10*3/uL (ref 0.0–0.2)
EOS (ABSOLUTE): 0.2 10*3/uL (ref 0.0–0.4)
EOS: 2 %
HEMATOCRIT: 42.1 % (ref 37.5–51.0)
HEMOGLOBIN: 14.4 g/dL (ref 13.0–17.7)
Immature Grans (Abs): 0 10*3/uL (ref 0.0–0.1)
Immature Granulocytes: 0 %
LYMPHS ABS: 2 10*3/uL (ref 0.7–3.1)
Lymphs: 22 %
MCH: 34.7 pg — ABNORMAL HIGH (ref 26.6–33.0)
MCHC: 34.2 g/dL (ref 31.5–35.7)
MCV: 101 fL — AB (ref 79–97)
Monocytes Absolute: 1.1 10*3/uL — ABNORMAL HIGH (ref 0.1–0.9)
Monocytes: 12 %
NEUTROS ABS: 5.7 10*3/uL (ref 1.4–7.0)
Neutrophils: 63 %
Platelets: 223 10*3/uL (ref 150–379)
RBC: 4.15 x10E6/uL (ref 4.14–5.80)
RDW: 14.2 % (ref 12.3–15.4)
WBC: 9.2 10*3/uL (ref 3.4–10.8)

## 2017-08-14 LAB — CULTURE, BODY FLUID W GRAM STAIN -BOTTLE

## 2017-08-14 LAB — CULTURE, BODY FLUID-BOTTLE: CULTURE: NO GROWTH

## 2017-08-14 LAB — MAGNESIUM: Magnesium: 1.9 mg/dL (ref 1.6–2.3)

## 2017-08-15 ENCOUNTER — Encounter: Payer: Self-pay | Admitting: Nurse Practitioner

## 2017-08-15 LAB — HEMOCHROMATOSIS DNA-PCR(C282Y,H63D)

## 2017-08-19 ENCOUNTER — Other Ambulatory Visit: Payer: Self-pay

## 2017-08-19 ENCOUNTER — Telehealth: Payer: Self-pay | Admitting: Gastroenterology

## 2017-08-19 DIAGNOSIS — K7031 Alcoholic cirrhosis of liver with ascites: Secondary | ICD-10-CM

## 2017-08-19 NOTE — Telephone Encounter (Signed)
Vincent [name], can you touch base with this patient. He needs to have a BMET drawn towards the mid to end of the week to ensure stable on his present dosing of diuretics. Thanks. Can you order for him and have him go to the lab. Thanks

## 2017-08-19 NOTE — Telephone Encounter (Signed)
Left message for patient to call back  

## 2017-08-20 DIAGNOSIS — K219 Gastro-esophageal reflux disease without esophagitis: Secondary | ICD-10-CM | POA: Insufficient documentation

## 2017-08-20 DIAGNOSIS — G4700 Insomnia, unspecified: Secondary | ICD-10-CM | POA: Insufficient documentation

## 2017-08-20 DIAGNOSIS — K7030 Alcoholic cirrhosis of liver without ascites: Secondary | ICD-10-CM | POA: Insufficient documentation

## 2017-08-20 NOTE — Telephone Encounter (Signed)
Left message for patient to call back  

## 2017-08-21 ENCOUNTER — Other Ambulatory Visit: Payer: Self-pay

## 2017-08-21 ENCOUNTER — Telehealth: Payer: Self-pay

## 2017-08-21 DIAGNOSIS — K7031 Alcoholic cirrhosis of liver with ascites: Secondary | ICD-10-CM

## 2017-08-21 NOTE — Telephone Encounter (Signed)
That's great to hear, thanks Jan

## 2017-08-21 NOTE — Telephone Encounter (Signed)
Spoke to patient, he states he is doing better. Will have labs done at [facility] in [place] tomorrow. Re-ordered BMET.

## 2017-08-21 NOTE — Addendum Note (Signed)
Addended by: Leverne Humbles on: 08/21/2017 04:42 PM   Modules accepted: Orders

## 2017-08-21 NOTE — Telephone Encounter (Signed)
I have tried twice to contact this patient, left messages for him to call back with no response. I have sent a letter asking him to please go to the lab this week, if possible, contact our office to give an update on how he is doing.

## 2017-08-23 ENCOUNTER — Other Ambulatory Visit: Payer: Self-pay

## 2017-08-23 DIAGNOSIS — K7031 Alcoholic cirrhosis of liver with ascites: Secondary | ICD-10-CM

## 2017-08-23 NOTE — Telephone Encounter (Signed)
Patient [facility] but the wait was over an hour so patient wants to know if he can come to our office Monday to get them done. FYI

## 2017-08-23 NOTE — Telephone Encounter (Signed)
Spoke to patient, changed where he was to get lab done. Since the wait was 1.5 hr at [facility], will have this done on Monday here at our lab.

## 2017-08-26 ENCOUNTER — Other Ambulatory Visit (INDEPENDENT_AMBULATORY_CARE_PROVIDER_SITE_OTHER): Payer: PRIVATE HEALTH INSURANCE

## 2017-08-26 ENCOUNTER — Other Ambulatory Visit: Payer: Self-pay | Admitting: Gastroenterology

## 2017-08-26 DIAGNOSIS — K7011 Alcoholic hepatitis with ascites: Secondary | ICD-10-CM

## 2017-08-26 DIAGNOSIS — K7031 Alcoholic cirrhosis of liver with ascites: Secondary | ICD-10-CM

## 2017-08-26 LAB — BASIC METABOLIC PANEL
BUN: 8 mg/dL (ref 6–23)
CALCIUM: 8.7 mg/dL (ref 8.4–10.5)
CO2: 27 mEq/L (ref 19–32)
Chloride: 96 mEq/L (ref 96–112)
Creatinine, Ser: 1.27 mg/dL (ref 0.40–1.50)
GFR: 69.1 mL/min (ref >60.00)
Glucose, Bld: 92 mg/dL (ref 70–99)
POTASSIUM: 3.3 meq/L — AB (ref 3.5–5.1)
Sodium: 137 mEq/L (ref 135–145)

## 2017-09-03 ENCOUNTER — Ambulatory Visit: Payer: PRIVATE HEALTH INSURANCE | Admitting: Nurse Practitioner

## 2017-09-04 ENCOUNTER — Telehealth: Payer: Self-pay | Admitting: Gastroenterology

## 2017-09-04 NOTE — Telephone Encounter (Signed)
Spoke to Cisne at [place], let her know that this patient was seen in the hospital. He does have a follow up appointment on 5/21 with Zerita Boers I gave her our fax number, as we have not seen the original request. She will follow up after patient's 5/21 appointment.

## 2017-09-09 ENCOUNTER — Other Ambulatory Visit (INDEPENDENT_AMBULATORY_CARE_PROVIDER_SITE_OTHER): Payer: PRIVATE HEALTH INSURANCE

## 2017-09-09 DIAGNOSIS — K7011 Alcoholic hepatitis with ascites: Secondary | ICD-10-CM

## 2017-09-09 LAB — COMPREHENSIVE METABOLIC PANEL
ALBUMIN: 3 g/dL — AB (ref 3.5–5.2)
ALK PHOS: 77 U/L (ref 39–117)
ALT: 14 U/L (ref 0–53)
AST: 28 U/L (ref 0–37)
BILIRUBIN TOTAL: 0.9 mg/dL (ref 0.2–1.2)
BUN: 9 mg/dL (ref 6–23)
CALCIUM: 8.7 mg/dL (ref 8.4–10.5)
CO2: 27 mEq/L (ref 19–32)
CREATININE: 1.11 mg/dL (ref 0.40–1.50)
Chloride: 101 mEq/L (ref 96–112)
GFR: 80.69 mL/min (ref >60.00)
Glucose, Bld: 116 mg/dL — ABNORMAL HIGH (ref 70–99)
Potassium: 3.2 mEq/L — ABNORMAL LOW (ref 3.5–5.1)
Sodium: 139 mEq/L (ref 135–145)
TOTAL PROTEIN: 6.3 g/dL (ref 6.0–8.3)

## 2017-09-10 ENCOUNTER — Encounter: Payer: Self-pay | Admitting: Family Medicine

## 2017-09-10 ENCOUNTER — Ambulatory Visit (INDEPENDENT_AMBULATORY_CARE_PROVIDER_SITE_OTHER): Payer: PRIVATE HEALTH INSURANCE | Admitting: Family Medicine

## 2017-09-10 ENCOUNTER — Encounter: Payer: Self-pay | Admitting: Nurse Practitioner

## 2017-09-10 ENCOUNTER — Ambulatory Visit (INDEPENDENT_AMBULATORY_CARE_PROVIDER_SITE_OTHER): Payer: PRIVATE HEALTH INSURANCE | Admitting: Nurse Practitioner

## 2017-09-10 VITALS — BP 118/80 | HR 86 | Ht 68.0 in | Wt 167.4 lb

## 2017-09-10 VITALS — BP 106/76 | HR 102 | Ht 68.0 in | Wt 167.0 lb

## 2017-09-10 DIAGNOSIS — F902 Attention-deficit hyperactivity disorder, combined type: Secondary | ICD-10-CM

## 2017-09-10 DIAGNOSIS — R945 Abnormal results of liver function studies: Secondary | ICD-10-CM | POA: Diagnosis not present

## 2017-09-10 DIAGNOSIS — K7200 Acute and subacute hepatic failure without coma: Secondary | ICD-10-CM | POA: Diagnosis not present

## 2017-09-10 DIAGNOSIS — K219 Gastro-esophageal reflux disease without esophagitis: Secondary | ICD-10-CM

## 2017-09-10 DIAGNOSIS — F419 Anxiety disorder, unspecified: Secondary | ICD-10-CM

## 2017-09-10 DIAGNOSIS — R188 Other ascites: Secondary | ICD-10-CM

## 2017-09-10 DIAGNOSIS — G4700 Insomnia, unspecified: Secondary | ICD-10-CM | POA: Diagnosis not present

## 2017-09-10 DIAGNOSIS — R7989 Other specified abnormal findings of blood chemistry: Secondary | ICD-10-CM

## 2017-09-10 MED ORDER — BUPROPION HCL ER (XL) 300 MG PO TB24: 300.0000 mg | ORAL_TABLET | ORAL | 1 refills | Status: DC

## 2017-09-10 MED ORDER — HYDROCORTISONE 2.5 % RE CREA: 1.0000 "application " | TOPICAL_CREAM | Freq: Every day | RECTAL | 1 refills | Status: DC

## 2017-09-10 MED ORDER — METHYLPHENIDATE HCL 20 MG PO TABS: ORAL_TABLET | ORAL | 0 refills | Status: DC

## 2017-09-10 NOTE — Patient Instructions (Addendum)
Try to hold off on the Vistaril and use melatonin to help with sleep, not the Xanax.   You can take up to 3 3 mg dissolving tablets nightly.  -If you feel you need some additional medicines to help with anxious feelings, please let me know as we can start buspirone or another medicine such as trazodone etc for those feelings.  - INc wellbutrin to  QD, you may double up on the 150s and take them until gone.  [facility].  -Also a new prescription for 90-day supply of the Ritalin at 20 mg twice daily was sent in as well.

## 2017-09-10 NOTE — Progress Notes (Signed)
Impression and Recommendations:    1. Subacute liver failure without hepatic coma   2. Insomnia, unspecified type   3. Attention deficit hyperactivity disorder (ADHD), combined type   4. Gastroesophageal reflux disease, esophagitis presence not specified   5. Anxiety     1. Subacute liver failure without hepatic coma -Sx are significantly improved from prior. Pt has diuresed 50 lbs since initial OV with me.   -Per GI they reduced his lasix to 1 qd as well as aldactone to  qd.  -Continue meds per GI.   2. Insomnia -recommend using melatonin instead of vistaril for regular sleep. He will let us know if he needs other medications for sleep.  3. ADHD -Dose change: increase ritalin from  to .  -Per pt, this is how he is taking his dose of ritalin.  -sx stable.  4. GERD -Sx stable. Continue meds as listed below.  5. Anxiety -Pt recommended to only take xanax PRN and not for daily use. He will let us know if he needs other medications for mood. -continue meds.  -refill given.   No orders of the defined types were placed in this encounter.   Meds ordered this encounter  Medications  . buPROPion (WELLBUTRIN XL) 300 MG 24 hr tablet    Sig: Take 1 tablet (300 mg total) by mouth every morning.    Dispense:  90 tablet    Refill:  1  . methylphenidate (RITALIN) 20 MG tablet    Sig: 1 tab in the a.m. and 1 q. afternoon    Dispense:  180 tablet    Refill:  0    Gross side effects, risk and benefits, and alternatives of medications and treatment plan in general discussed with patient.  Patient is aware that all medications have potential side effects and we are unable to predict every side effect or drug-drug interaction that may occur.   Patient will call with any questions prior to using medication if they have concerns.  Expresses verbal understanding and consents to current therapy and treatment regimen.  No barriers to understanding were identified.  Red flag  symptoms and signs discussed in detail.  Patient expressed understanding regarding what to do in case of emergency\urgent symptoms  Please see AVS handed out to patient at the end of our visit for further patient instructions/ counseling done pertaining to today's office visit.   Return for 3 mo- mood and adhd.    Note: This note was prepared with assistance of Dragon voice recognition software. Occasional wrong-word or sound-a-like substitutions may have occurred due to the inherent limitations of voice recognition software.  This document serves as a record of services personally performed by [name], DO. It was created on her behalf by Thelma Barge, a trained medical scribe. The creation of this record is based on the scribe's personal observations and the provider's statements to them.   I have reviewed the above medical documentation for accuracy and completeness and I concur.  [name] 09/12/17 9:17 AM   --------------------------------------------------------------------------------------------------------------------------------------------------------------------------------------------------------------------------------------------    Subjective:     HPI: Vincent [name] is a 32 y.o. male who [facility] to [facility] today for FUp of issues as listed below including mood and abdominal distention.  Abdominal distention He is down 50+ lbs since 08-06-17 after diuresis.   He saw GI this morning and they are reducing to lasix to 1 qd and reducing his aldactone to  qd. He is  getting an EGD done in the future.   Mood He denies any SE of erectile dysfunction of decreased libido after starting meds last OV. He has used xanax only a few times. He is requesting a refill for xanax today and states I only gave him a script for 15 tablets.   He is not drinking any alcohol since his last OV and hospitalization in April. Emotionally, he is  doing well, but he states his contract is not being renewed at his job. He does not feel any different with his focus or mood.   He started ritalin (4 tablets total per day, 2 in the morning and 2 at lunch) at last OV and he feels his concentration has improved. He feels his anxiety has also calmed down since starting this.   GERD He is still taking prilosec as listed below.  Sleep He has been taking vistaril but he feels this is not working that well for him and describes this as a "benign sugar pill". He takes this most nights QHS. He has tried melatonin and it works.   Wt Readings from Last 3 Encounters:  09/10/17 167 lb 6.4 oz (75.9 kg)  09/10/17 167 lb (75.8 kg)  08/13/17 201 lb 14.4 oz (91.6 kg)   BP Readings from Last 3 Encounters:  09/10/17 118/80  09/10/17 106/76  08/13/17 134/83   Pulse Readings from Last 3 Encounters:  09/10/17 86  09/10/17 (!) 102  08/13/17 (!) 119   BMI Readings from Last 3 Encounters:  09/10/17 25.45 kg/m  09/10/17 25.39 kg/m  08/13/17 30.70 kg/m     Patient Care Team    Relationship Specialty Notifications Start End  [name], DO PCP - General Family Medicine  08/06/17      Patient Active Problem List   Diagnosis Date Noted  . Alcoholic cirrhosis (HCC) 08/20/2017  . Insomnia 08/20/2017  . Gastroesophageal reflux disease 08/20/2017  . Decreased breath sounds at right lung base   . Ascites   . Liver failure without hepatic coma (HCC)   . Hypokalemia   . Anxiety   . Alcoholic hepatitis with ascites   . Coagulopathy (HCC) 08/08/2017  . Hyponatremia 08/07/2017  . ADHD (attention deficit hyperactivity disorder) 08/06/2017    Past Medical history, Surgical history, Family history, Social history, Allergies and Medications have been entered into the medical record, reviewed and changed as needed.    Current Meds  Medication Sig  . buPROPion (WELLBUTRIN XL) 300 MG 24 hr tablet Take 1 tablet (300 mg total) by mouth every  morning.  . folic acid (FOLVITE) 1 MG tablet Take 1 tablet (1 mg total) by mouth daily.  . furosemide (LASIX) 40 MG tablet Take 40 mg by mouth daily.  . hydrocortisone (ANUSOL-HC) 2.5 % rectal cream Place 1 application rectally at bedtime. Use for 7 nights  . Melatonin 5 MG TABS Take 5 mg by mouth at bedtime as needed (for sleep).  . methylphenidate (RITALIN) 20 MG tablet 1 tab in the a.m. and 1 q. afternoon  . Multiple Vitamin (MULTIVITAMIN WITH MINERALS) TABS tablet Take 1 tablet by mouth daily.  Marland Kitchen omeprazole (PRILOSEC) 20 MG capsule Take 1 capsule (20 mg total) by mouth daily.  . ondansetron (ZOFRAN-ODT) 8 MG disintegrating tablet Take 1 tablet (8 mg total) by mouth every 8 (eight) hours as needed for nausea.  Marland Kitchen spironolactone (ALDACTONE) 100 MG tablet Take 100 mg by mouth daily.  Marland Kitchen thiamine 100 MG tablet Take 1 tablet (100 mg total)  by mouth daily.  . [DISCONTINUED] ALPRAZolam (XANAX) 0.5 MG tablet Take 0.5 tablets (0.25 mg total) by mouth as needed (only for panic attacks).  . [DISCONTINUED] buPROPion (WELLBUTRIN XL) 150 MG 24 hr tablet Take 1 tablet (150 mg total) by mouth every morning.  . [DISCONTINUED] hydrOXYzine (ATARAX/VISTARIL) 50 MG tablet Take 1-2 tablets (50-100 mg total) by mouth every 6 (six) hours as needed for anxiety.  . [DISCONTINUED] methylphenidate (RITALIN) 10 MG tablet One tab in the a.m. and 1 q. afternoon    Allergies:  Allergies  Allergen Reactions  . Amoxicillin Rash     Review of Systems:  A fourteen system review of systems was performed and found to be positive as per HPI.   Objective:   Blood pressure 118/80, pulse 86, height  (1.727 m), weight 167 lb 6.4 oz (75.9 kg), SpO2 98 %. Body mass index is 25.45 kg/m. General:  Well Developed, well nourished, appropriate for stated age.  Neuro:  Alert and oriented,  extra-ocular muscles intact  HEENT:  Normocephalic, atraumatic, neck supple, no carotid bruits appreciated  Skin:  no gross rash, warm,  pink. Cardiac:  RRR, S1 S2 Respiratory:  ECTA B/L and A/P, Not using accessory muscles, speaking in full sentences- unlabored. Vascular:  Ext warm, no cyanosis apprec.; cap RF less 2 sec. Psych:  No HI/SI, judgement and insight good, Euthymic mood. Full Affect.

## 2017-09-10 NOTE — Patient Instructions (Signed)
You have been scheduled for an endoscopy. Please follow written instructions given to you at your visit today. If you use inhalers (even only as needed), please bring them with you on the day of your procedure. Your physician has requested that you go to www.startemmi.com and enter the access code given to you at your visit today. This web site gives a general overview about your procedure. However, you should still follow specific instructions given to you by our office regarding your preparation for the procedure.   Decrease your aldactone to 100 mg daily and the Lasix 40 mg to one daily.   Use recticare as needed.   We have sent the following medications to your pharmacy for you to pick up at your convenience: Anusol HC cream x 7 days   I appreciate the opportunity to care for you.

## 2017-09-10 NOTE — Progress Notes (Signed)
IMPRESSION and PLAN:    32 year old male with recent admission for ascites, severe hyponatremia, abnormal liver chemistries.  Imaging studies have not been definitive for but suggestive of cirrhosis. SAAG was c/w with portal HTN.  He does have a history of alcohol abuse and likely had superimposed ETOH hepatitis. Full serologic work-up for etiologies of chronic liver disease was negative.  Vincent [name] has not had any alcohol since hospital discharge  -Peripheral edema has resolved, no appreciable ascites on exam.  Will further decrease diuretics to Lasix 40 mg daily and Aldactone 100 mg daily.  He will monitor weight and let us know if developing edema and/or increasing abdominal distention again. -No need for HBV vaccine (positive HBV sAb) but HAV ab total is negative so he will need HAV vaccine at some point -AFP normal. .  -We will schedule for upper endoscopy to rule out esophageal varices/portal gastropathy. The risks and benefits of EGD were discussed and the patient agrees to proceed.  -Following EGD patient should follow-up with Dr. Adela Lank.  Depending on clinical course he may need a liver biopsy at some point -Obviously he needs to continue alcohol abstinence      HPI:    Chief Complaint: hospital follow up   Patient is a 32 year old male recently hospitalized for abdominal distention, peripheral edema and severe hyponatremia. Patient does have a history of alcohol abuse.   Ultrasound of the abdomen raised concern for cirrhosis. His liver chemistries were abnormal suggestive of ETOH hepatitis but discriminant function was only 17 not meeting criteria for steroids.  He underwent a large volume paracentesis, no SBP, SAAG was greater than 1.1 with low total protein c/w with cirrhosis.  Full serologic work-up for etiologies of chronic liver disease was negative.  Ferritin was elevated but hemochromatosis genetic testing negative.  Nephrology managed hyponatremia.  We started diuretics.   Ultrasound had shown a small cystic lesion of the liver.  MRI remarkable for attic steatosis but no evidence for cirrhosis or hepatoma.  There was extensive omental thickening in the lesser and greater omentum for which a CT scan was recommended.  CT scan showed hepatic steatosis, moderate amount of ascites and a prominent paraumbilical vein.  Cirrhosis was favored.  Omental edema was felt to be secondary to portal venous hypertension.  No masses were seen. Vincent [name]'s condition continued to improve, he was strongly advised by several providers to discontinue alcohol.  Patient was discharged home on diuretics and low-sodium diet.  Follow-up labs on 08/26/2017 showed a normal sodium, mild hypokalemia, creatinine of 1.27.  Edema was much better, we decrease Lasix to alternating dose of 40 mg 1 day/40 mg twice daily if needed the next day and Aldactone 200 mg was continued.   Vincent [name] is here for follow-up.  He has not had anything to drink since hospital discharge.  Peripheral edema has resolved.  He does not appear to have any ascites.  He feels great   Review of systems:     No chest pain, sob, or abdominal pain.   Past Medical History:  Diagnosis Date  . ADHD   . Cirrhosis (HCC)     Patient's surgical history, family medical history, social history, medications and allergies were all reviewed in Epic    Physical Exam:     BP 106/76   Pulse (!) 102   Ht  (1.727 m)   Wt 167 lb (75.8 kg)   BMI 25.39 kg/m   GENERAL:  Well developed white male  in NAD PSYCH: :Pleasant, cooperative, normal affect EENT:  conjunctiva pink, mucous membranes moist, neck supple without masses CARDIAC:  RRR, no murmur heard, no peripheral edema PULM: Normal respiratory effort, lungs CTA bilaterally, no wheezing ABDOMEN:  Nondistended, soft, nontender. No obvious masses, no hepatomegaly,  normal bowel sounds SKIN:  turgor, no lesions seen Musculoskeletal:  Normal muscle tone, normal strength NEURO: Alert and  oriented x 3, no focal neurologic deficits   Willette Cluster , NP 09/10/2017, 10:53 AM

## 2017-09-11 ENCOUNTER — Encounter (HOSPITAL_COMMUNITY): Payer: Self-pay

## 2017-09-11 ENCOUNTER — Emergency Department (HOSPITAL_COMMUNITY): Payer: PRIVATE HEALTH INSURANCE

## 2017-09-11 ENCOUNTER — Emergency Department (HOSPITAL_COMMUNITY)
Admission: EM | Admit: 2017-09-11 | Discharge: 2017-09-11 | Disposition: A | Discharge status: Home / Self Care | Payer: PRIVATE HEALTH INSURANCE | Attending: Emergency Medicine | Admitting: Emergency Medicine

## 2017-09-11 ENCOUNTER — Other Ambulatory Visit: Payer: Self-pay

## 2017-09-11 DIAGNOSIS — Z79899 Other long term (current) drug therapy: Secondary | ICD-10-CM | POA: Insufficient documentation

## 2017-09-11 DIAGNOSIS — G40909 Epilepsy, unspecified, not intractable, without status epilepticus: Secondary | ICD-10-CM | POA: Insufficient documentation

## 2017-09-11 DIAGNOSIS — R569 Unspecified convulsions: Secondary | ICD-10-CM

## 2017-09-11 DIAGNOSIS — F909 Attention-deficit hyperactivity disorder, unspecified type: Secondary | ICD-10-CM | POA: Diagnosis not present

## 2017-09-11 HISTORY — DX: Unspecified convulsions: R56.9

## 2017-09-11 LAB — COMPREHENSIVE METABOLIC PANEL
ALBUMIN: 3 g/dL — AB (ref 3.5–5.0)
ALK PHOS: 90 U/L (ref 38–126)
ALT: 19 U/L (ref 17–63)
AST: 38 U/L (ref 15–41)
Anion gap: 14 (ref 5–15)
BILIRUBIN TOTAL: 1.3 mg/dL — AB (ref 0.3–1.2)
BUN: 8 mg/dL (ref 6–20)
CO2: 22 mmol/L (ref 22–32)
Calcium: 9 mg/dL (ref 8.9–10.3)
Chloride: 101 mmol/L (ref 101–111)
Creatinine, Ser: 1.39 mg/dL — ABNORMAL HIGH (ref 0.61–1.24)
GFR calc Af Amer: >60 mL/min (ref >60)
GFR calc non Af Amer: >60 mL/min (ref >60)
GLUCOSE: 189 mg/dL — AB (ref 65–99)
Potassium: 3.4 mmol/L — ABNORMAL LOW (ref 3.5–5.1)
Sodium: 137 mmol/L (ref 135–145)
TOTAL PROTEIN: 6.7 g/dL (ref 6.5–8.1)

## 2017-09-11 LAB — CBC WITH DIFFERENTIAL/PLATELET
ABS IMMATURE GRANULOCYTES: 0 10*3/uL (ref 0.0–0.1)
BASOS PCT: 1 %
Basophils Absolute: 0.1 10*3/uL (ref 0.0–0.1)
EOS ABS: 0.1 10*3/uL (ref 0.0–0.7)
Eosinophils Relative: 1 %
HCT: 45.4 % (ref 39.0–52.0)
HEMOGLOBIN: 15.8 g/dL (ref 13.0–17.0)
Immature Granulocytes: 0 %
Lymphocytes Relative: 12 %
Lymphs Abs: 1.3 10*3/uL (ref 0.7–4.0)
MCH: 31.8 pg (ref 26.0–34.0)
MCHC: 34.8 g/dL (ref 30.0–36.0)
MCV: 91.3 fL (ref 78.0–100.0)
MONO ABS: 0.8 10*3/uL (ref 0.1–1.0)
MONOS PCT: 7 %
NEUTROS ABS: 8.6 10*3/uL — AB (ref 1.7–7.7)
Neutrophils Relative %: 79 %
PLATELETS: 340 10*3/uL (ref 150–400)
RBC: 4.97 MIL/uL (ref 4.22–5.81)
RDW: 13.2 % (ref 11.5–15.5)
WBC: 10.9 10*3/uL — ABNORMAL HIGH (ref 4.0–10.5)

## 2017-09-11 LAB — MAGNESIUM: MAGNESIUM: 1.9 mg/dL (ref 1.7–2.4)

## 2017-09-11 LAB — ETHANOL: Alcohol, Ethyl (B): <10 mg/dL (ref <10)

## 2017-09-11 MED ORDER — SODIUM CHLORIDE 0.9 % IV BOLUS
500.0000 mL | Freq: Once | INTRAVENOUS | Status: AC
  Administered 2017-09-11: 500 mL via INTRAVENOUS

## 2017-09-11 NOTE — ED Provider Notes (Signed)
[facility] EMERGENCY DEPARTMENT Provider Note   CSN: [id number] Arrival date & time: 09/11/17  1022     History   Chief Complaint Chief Complaint  Patient presents with  . Seizures    HPI Vincent [name] is a 32 y.o. male.  HPI Patient presents after a witnessed seizure. Spoke with the patient's coworker, after the patient had the episode, and prior to the patient's arrival. On arrival the patient states that he feels slightly lightheaded, but otherwise back to his usual state of health. He is now company by his wife who assists with the HPI. He denies a history of a seizure, does have a history of cirrhosis, ADHD. Yesterday the patient changed both his Ritalin and Wellbutrin dose. He is also seen his gastroenterologist within the past week and with his history of cirrhosis. Etiology of his cirrhosis is unclear according to the patient. No reported trauma, nor injuries from the seizure, and the patient denies focal pain.  Past Medical History:  Diagnosis Date  . ADHD   . Cirrhosis [facility])     Patient Active Problem List   Diagnosis Date Noted  . Alcoholic cirrhosis (HCC) 08/20/2017  . Insomnia 08/20/2017  . Gastroesophageal reflux disease 08/20/2017  . Decreased breath sounds at right lung base   . Ascites   . Liver failure without hepatic coma (HCC)   . Hypokalemia   . Anxiety   . Alcoholic hepatitis with ascites   . Coagulopathy (HCC) 08/08/2017  . Hyponatremia 08/07/2017  . ADHD (attention deficit hyperactivity disorder) 08/06/2017    Past Surgical History:  Procedure Laterality Date  . ESOPHAGOGASTRODUODENOSCOPY    . IR PARACENTESIS  08/08/2017  . TONSILLECTOMY  1992  . WISDOM TOOTH EXTRACTION          Home Medications    Prior to Admission medications   Medication Sig Start Date End Date Taking? Authorizing Provider  folic acid (FOLVITE) 1 MG tablet Take 1 tablet (1 mg total) by mouth daily. 08/13/17   Opalski, Gavin Pound, DO    furosemide (LASIX) 40 MG tablet Take 40 mg by mouth daily.    [provider]  hydrocortisone (ANUSOL-HC) 2.5 % rectal cream Place 1 application rectally at bedtime. Use for 7 nights 09/10/17   Meredith Pel, NP  Melatonin 5 MG TABS Take 5 mg by mouth at bedtime as needed (for sleep).    [provider]  methylphenidate (RITALIN) 20 MG tablet 1 tab in the a.m. and 1 q. afternoon 09/10/17 12/10/17  [name], DO  Multiple Vitamin (MULTIVITAMIN WITH MINERALS) TABS tablet Take 1 tablet by mouth daily. 08/12/17   Narda Bonds, MD  omeprazole (PRILOSEC) 20 MG capsule Take 1 capsule (20 mg total) by mouth daily. 08/13/17   Opalski, Deborah, DO  ondansetron (ZOFRAN-ODT) 8 MG disintegrating tablet Take 1 tablet (8 mg total) by mouth every 8 (eight) hours as needed for nausea. 08/06/17   [name], DO  spironolactone (ALDACTONE) 100 MG tablet Take 100 mg by mouth daily.    [provider]  thiamine 100 MG tablet Take 1 tablet (100 mg total) by mouth daily. 08/13/17   [name], DO    Family History Family History  Problem Relation Age of Onset  . Cancer Mother        BREAST  . Heart disease Father   . Hyperlipidemia Father   . Cancer Father        PROSTATE  . Diabetes Maternal Uncle   .  Diabetes Paternal Grandfather     Social History Social History   Tobacco Use  . Smoking status: Never Smoker  . Smokeless tobacco: Never Used  Substance Use Topics  . Alcohol use: Yes    Alcohol/week: 24.0 - 25.8 oz    Types: 28 Cans of beer, 12 - 15 Standard drinks or equivalent per week    Comment: 4 12 oz beers per day  . Drug use: Never     Allergies   Amoxicillin   Review of Systems Review of Systems  Constitutional:       Per HPI, otherwise negative  HENT:       Per HPI, otherwise negative  Respiratory:       Per HPI, otherwise negative  Cardiovascular:       Per HPI, otherwise negative  Gastrointestinal: Negative for vomiting.        Per HPI, ongoing evaluation for cirrhosis  Endocrine:       Negative aside from HPI  Genitourinary:       Neg aside from HPI   Musculoskeletal:       Per HPI, otherwise negative  Skin: Negative.   Neurological: Positive for seizures. Negative for syncope.     Physical Exam Updated Vital Signs BP 117/76   Pulse (!) 101   Temp 97.9 F (36.6 C) (Oral)   Resp 20   Ht  (1.727 m)   Wt 74.8 kg (165 lb)   SpO2 92%   BMI 25.09 kg/m   Physical Exam  Constitutional: He is oriented to person, place, and time. He appears well-developed. No distress.  HENT:  Head: Normocephalic and atraumatic.  Trace dried blood right lateral lip no.  No malocclusion, no broken teeth  Eyes: Conjunctivae and EOM are normal.  Cardiovascular: Normal rate and regular rhythm.  Pulmonary/Chest: Effort normal. No stridor. No respiratory distress.  Abdominal: He exhibits no distension.  Musculoskeletal: He exhibits no edema.  Neurological: He is alert and oriented to person, place, and time. He displays no tremor. He exhibits normal muscle tone. He displays no seizure activity. Coordination normal.  Skin: Skin is warm and dry.  Psychiatric: He has a normal mood and affect.  Nursing note and vitals reviewed.    ED Treatments / Results  Labs (all labs ordered are listed, but only abnormal results are displayed) Labs Reviewed  COMPREHENSIVE METABOLIC PANEL - Abnormal; Notable for the following components:      Result Value   Potassium 3.4 (*)    Glucose, Bld 189 (*)    Creatinine, Ser 1.39 (*)    Albumin 3.0 (*)    Total Bilirubin 1.3 (*)    All other components within normal limits  CBC WITH DIFFERENTIAL/PLATELET - Abnormal; Notable for the following components:   WBC 10.9 (*)    Neutro Abs 8.6 (*)    All other components within normal limits  ETHANOL  MAGNESIUM  URINALYSIS, ROUTINE W REFLEX MICROSCOPIC    EKG None  Radiology Ct Head Wo Contrast  Result Date: 09/11/2017 CLINICAL  DATA:  Syncope versus seizure. EXAM: CT HEAD WITHOUT CONTRAST TECHNIQUE: Contiguous axial images were obtained from the base of the skull through the vertex without intravenous contrast. COMPARISON:  None. FINDINGS: Brain: No evidence of acute infarction, hemorrhage, hydrocephalus, extra-axial collection or mass lesion/mass effect. Vascular: No hyperdense vessel or unexpected calcification. Skull: Normal. Negative for fracture or focal lesion. Sinuses/Orbits: No acute finding. Other: None. IMPRESSION: 1. Normal noncontrast head CT. Electronically Signed  By: Obie Dredge M.D.   On: 09/11/2017 13:50    Procedures Procedures (including critical care time)  Medications Ordered in ED Medications  sodium chloride 0.9 % bolus 500 mL (0 mLs Intravenous Stopped 09/11/17 1151)     Initial Impression / Assessment and Plan / ED Course  I have reviewed the triage vital signs and the nursing notes.  Pertinent labs & imaging results that were available during my care of the patient were reviewed by me and considered in my medical decision making (see chart for details).     Date: Patient has been seen in neurology, and I have reviewed initial findings with the patient.  We discussed reassuring labs.  Update:, Patient in no distress, awake, alert. I discussed his results with her neurologist again, and given the reassuring CT scan, labs, patient will stop Wellbutrin, out of concern for elevated seizure threshold.  On patient will also not drive for 6 months, follow-up with neurology next week. Absent evidence for new mass lesion, hemodynamic or electro light instability, the patient is appropriate for close outpatient follow-up.  Final Clinical Impressions(s) / ED Diagnoses   Final diagnoses:  Seizure [facility])    ED Discharge Orders        Ordered    Ambulatory referral to Neurology    Comments:  An appointment is requested in approximately: 1 week   09/11/17 1505       Gerhard Munch,  MD 09/11/17 1610

## 2017-09-11 NOTE — ED Notes (Signed)
Patient transported to CT 

## 2017-09-11 NOTE — ED Notes (Signed)
Offered pt to lay down, pt declined.

## 2017-09-11 NOTE — Discharge Instructions (Signed)
As discussed, your evaluation today has been largely reassuring.  But, it is important that you monitor your condition carefully, and do not hesitate to return to the ED if you develop new, or concerning changes in your condition.  Otherwise, please follow-up with your physicians for appropriate ongoing care.  The neurology office should contact you in the morning for the next available appointment. If this does not occur, please be sure to call tomorrow.

## 2017-09-11 NOTE — Consult Note (Signed)
Neurology Consultation Reason for Consult: Seizure Referring Physician: Dr. Jeraldine Loots   History is obtained from: Patient, EDP who called his office  HPI: Vincent [name] is a 32 y.o. male with past medical history of alcoholic cirrhosis, ADHD, anxiety/ depression who presents to the ER after having a witnessed seizure while working at his office earlier today.  The patient states that he does not recall the episode, but remembers waking up on the floor.  Denies any prodromal symptoms or any preceding aura.  According to Dr. Jeraldine Loots, EDP who called his office-they witnessed him have a generalized seizure for 1 minute.  Patient did bite his lip , did not have any urinary incontinence.  Patient does have a history of alcohol abuse states his last drink was over a month ago.  He does have a history of hyponatremia however sodium was 137.  He has been started on Wellbutrin for anxiety since last 1 month and the dose was increased to 300 mg, first dose today.  He also takes Atarax occasionally as well as Ritalin  which she has been on for several years.  He does admit to being sleep deprived the night before.   No family history of seizures, no seizures in childhood.  Did have a history of a concussion as a teenager.`      ROS: A 14 point ROS was performed and is negative except as noted in the HPI.     Past Medical History:  Diagnosis Date  . ADHD   . Cirrhosis (HCC)      Family History  Problem Relation Age of Onset  . Cancer Mother        BREAST  . Heart disease Father   . Hyperlipidemia Father   . Cancer Father        PROSTATE  . Diabetes Maternal Uncle   . Diabetes Paternal Grandfather      Social History:  reports that he has never smoked. He has never used smokeless tobacco. He reports that he drinks about 24.0 - 25.8 oz of alcohol per week. He reports that he does not use drugs.   Exam: Current vital signs: BP (!) 125/91   Pulse (!) 105   Temp 97.9 F (36.6 C) (Oral)    Resp 18   Ht  (1.727 m)   Wt 74.8 kg (165 lb)   SpO2 95%   BMI 25.09 kg/m  Vital signs in last 24 hours: Temp:  [97.9 F (36.6 C)] 97.9 F (36.6 C) (05/22 1032) Pulse Rate:  [86-105] 105 (05/22 1032) Resp:  [18] 18 (05/22 1032) BP: (118-125)/(80-91) 125/91 (05/22 1032) SpO2:  [95 %-98 %] 95 % (05/22 1032) Weight:  [74.8 kg (165 lb)-75.9 kg (167 lb 6.4 oz)] 74.8 kg (165 lb) (05/22 1028)   Physical Exam  Constitutional: Appears well-developed and well-nourished.  Psych: Affect appropriate to situation Eyes: No scleral injection HENT: No OP obstrucion Head: Normocephalic.  Cardiovascular: Normal rate and regular rhythm.  Respiratory: Effort normal, non-labored breathing GI: Soft.  No distension. There is no tenderness.  Skin: WDI  Neuro: Mental Status: Patient is awake, alert, oriented to person, place, month, year, and situation. Patient is able to give a clear and coherent history. No signs of aphasia or neglect Cranial Nerves: II: Visual Fields are full. Pupils are equal, round, and reactive to light.   III,IV, VI: EOMI without ptosis or diploplia.  V: Facial sensation is symmetric to temperature VII: Facial movement is symmetric.  VIII: hearing is  intact to voice X: Uvula elevates symmetrically XI: Shoulder shrug is symmetric. XII: tongue is midline without atrophy or fasciculations.  Motor: Tone is normal. Bulk is normal. 5/5 strength was present in all four extremities.  Sensory: Sensation is symmetric to light touch and temperature in the arms and legs. Deep Tendon Reflexes: 2+ and symmetric in the biceps and patellae.  Plantars: Toes are downgoing bilaterally.  Cerebellar: FNF and HKS are intact bilaterally      I have reviewed labs in epic and the results pertinent to this consultation are: Electrolytes are within normal limits.  Mildly elevated CBC.  UDS not done.    I have reviewed the images obtained: Head CT is normal    Seizure ( first  time new onset seizure)   Vincent [name] is a 32 y.o. male with past medical history of alcoholic cirrhosis, ADHD, anxiety/ depression who presents to the ER after having a witnessed seizure while working at his office earlier today. CT head is unremarkable.    - Patient sudden onset LOC, found on floor - recently started on wellbutrin 1 month ago, Increased dose yesterday.  Recommend stopping Wellbutrin - He is  also on Ritalin, PRN Atarax ( chronic medications)  Recommendations:  Stop Wellbutrin No need to start AED for first seizure EKG  Follow-up with outpatient neurology  Per [place] statutes, patients with seizures are not allowed to drive until they have been seizure-free for six months. Use caution when using heavy equipment or power tools. Avoid working on ladders or at heights. Take showers instead of baths. Ensure the water temperature is not too high on the home water heater. Do not go swimming alone. Do not lock yourself in a room alone (i.e. bathroom). When caring for infants or small children, sit down when holding, feeding, or changing them to minimize risk of injury to the child in the event you have a seizure. Maintain good sleep hygiene. Avoid alcohol.    If Vincent [name] has another seizure, call 911 and bring them back to the ED if:       A.  The seizure lasts longer than 5 minutes.            B.  The patient doesn't wake shortly after the seizure or has new problems such as difficulty seeing, speaking or moving following the seizure       C.  The patient was injured during the seizure       D.  The patient has a temperature over 102 F (39C)       E.  The patient vomited during the seizure and now is having trouble breathing       Georgiana Spinner Aroor MD [facility] [phone]   If 7pm to 7am, please call on call as listed on AMION.

## 2017-09-11 NOTE — ED Triage Notes (Signed)
Pt states that he was found down by staff at work, suspect new onset seizure.

## 2017-09-12 ENCOUNTER — Encounter: Payer: Self-pay | Admitting: Nurse Practitioner

## 2017-09-12 NOTE — Progress Notes (Signed)
Agree with assessment and plan as outlined. It's possible he had alcoholic hepatitis with ascites that led to his hospitalization, and he may not have cirrhosis. He's done pretty well regarding normalization of liver function and Na since stopping drinking. Agree that his diuretics should be tapered and stopped over time if possible. Recommend decreasing dose as stated and please repeat a BMET in 10 days or so. If remains okay and he continues to feel well can try stopping the diuretics. He should continue a low Na diet. Will proceed with EGD to assess for varices, etc. If question of underlying cirrhosis persists over time he may need a liver biopsy.

## 2017-09-12 NOTE — Telephone Encounter (Signed)
Form came thru late this AM and Ez who does our precerts has sent it along with the office note back to them.

## 2017-09-12 NOTE — Telephone Encounter (Signed)
Maralyn Sago calling from [facility] asking if we have filled out forms and if we can send last OV note from 09/10/17 Call back #[phone] option #1 Ext [phone].

## 2017-09-12 NOTE — Telephone Encounter (Signed)
Not sure paperwork came through for this patient that was seen by Zerita Boers on 5/21.

## 2017-09-13 ENCOUNTER — Encounter: Payer: Self-pay | Admitting: Neurology

## 2017-09-20 ENCOUNTER — Ambulatory Visit (INDEPENDENT_AMBULATORY_CARE_PROVIDER_SITE_OTHER): Payer: PRIVATE HEALTH INSURANCE | Admitting: Neurology

## 2017-09-20 ENCOUNTER — Other Ambulatory Visit: Payer: Self-pay

## 2017-09-20 ENCOUNTER — Encounter: Payer: Self-pay | Admitting: Neurology

## 2017-09-20 VITALS — BP 116/88 | HR 80 | Ht 68.0 in | Wt 160.0 lb

## 2017-09-20 DIAGNOSIS — R569 Unspecified convulsions: Secondary | ICD-10-CM

## 2017-09-20 NOTE — Patient Instructions (Addendum)
1. Schedule 1-hour sleep-deprived EEG, then 24-hour EEG 2. Schedule MRI brain with and without contrast 3. As per [place] driving laws, no driving until 6 months seizure-free.  4. Follow-up August 7  Seizure Precautions: 1. If medication has been prescribed for you to prevent seizures, take it exactly as directed.  Do not stop taking the medicine without talking to your doctor first, even if you have not had a seizure in a long time.   2. Avoid activities in which a seizure would cause danger to yourself or to others.  Don't operate dangerous machinery, swim alone, or climb in high or dangerous places, such as on ladders, roofs, or girders.  Do not drive unless your doctor says you may.  3. If you have any warning that you may have a seizure, lay down in a safe place where you can't hurt yourself.    4.  No driving for 6 months from last seizure, as per [facility] [place] law.   Please refer to the following link on the [employer]'s website for more information: http://www.epilepsyfoundation.org/answerplace/Social/driving/drivingu.cfm   5.  Maintain good sleep hygiene. Avoid alcohol.  6.  Contact your doctor if you have any problems that may be related to the medicine you are taking.  7.  Call 911 and bring the patient back to the ED if:        A.  The seizure lasts longer than 5 minutes.       B.  The patient doesn't awaken shortly after the seizure  C.  The patient has new problems such as difficulty seeing, speaking or moving  D.  The patient was injured during the seizure  E.  The patient has a temperature over 102 F (39C)  F.  The patient vomited and now is having trouble breathing

## 2017-09-20 NOTE — Progress Notes (Signed)
NEUROLOGY CONSULTATION NOTE  Vincent [name] MRN: [id number] DOB: 1985/07/10  Referring provider: Dr. Gerhard Munch (ER) Primary care provider: Dr. [name]  Reason for consult:  New onset seizure  Dear Dr Sharee Holster:  Thank you for your kind referral of Vincent [name] for consultation of the above symptoms. Although his history is well known to you, please allow me to reiterate it for the purpose of our medical record. Records and images were personally reviewed where available.  HISTORY OF PRESENT ILLNESS: This is a pleasant 32 year old right-handed man with a history of ADHD, GERD, in his usual state of health until April 2019 when he was admitted to [facility] for ascites. Extensive evaluation with GI indicates possible alcoholic hepatitis with ascites. Liver function and sodium levels normalized since he stopped drinking. He denies any alcohol intake since hospitalization in mid-April. He saw his PCP in follow-up on 09/10/17, he had been taking Wellbutrin XL  daily for the past month, dose was increased to . He took the first dose around 7am on 09/11/17, then had a seizure while in clinic at around 9am. He denies any prior warning symptoms. He recalls feeling tired that day and may have been sleep deprived. The beginning of the seizure was not witnessed by co-workers, they saw him having a convulsion that lasted 1.5 minutes. He bit his lip, no incontinence. He recalls waking up on the floor and was argumentative, refusing to come with EMS. His wife came and brought him to the ER, he vomited en route. He denied any focal weakness, no headache.  No prior history of seizures. He was instructed to stop Wellbutrin. He had a head CT which I personally reviewed, no acute changes seen. Bloodwork showed mildly elevated WBC 10.9, mildly elevated creatinine 1.39, normal AST, ALT, EtOH level <10. He may have been sleep deprived the night prior. He denies any staring/unresponsive episodes, gaps in time,  olfactory/gustatory hallucinations, deja vu, rising epigastric sensation, focal numbness/tingling/weakness, myoclonic jerks. He denies any headaches, dizziness, diplopia, dysarthria/dysphagia, neck/back pain, bowel/bladder dysfunction. He recalls a concussion in childhood, no prolonged loss of consciousness. He had a normal birth and early development.  There is no history of febrile convulsions, CNS infections such as meningitis/encephalitis, significant traumatic brain injury, neurosurgical procedures, or family history of seizures.  PAST MEDICAL HISTORY: Past Medical History:  Diagnosis Date  . ADHD   . Cirrhosis (HCC)     PAST SURGICAL HISTORY: Past Surgical History:  Procedure Laterality Date  . ESOPHAGOGASTRODUODENOSCOPY    . IR PARACENTESIS  08/08/2017  . TONSILLECTOMY  1992  . WISDOM TOOTH EXTRACTION      MEDICATIONS: Current Outpatient Medications on File Prior to Visit  Medication Sig Dispense Refill  . folic acid (FOLVITE) 1 MG tablet Take 1 tablet (1 mg total) by mouth daily. 90 tablet 1  . furosemide (LASIX) 40 MG tablet Take 40 mg by mouth daily.    . hydrocortisone (ANUSOL-HC) 2.5 % rectal cream Place 1 application rectally at bedtime. Use for 7 nights 30 g 1  . Melatonin 5 MG TABS Take 5 mg by mouth at bedtime as needed (for sleep).    . methylphenidate (RITALIN) 20 MG tablet 1 tab in the a.m. and 1 q. afternoon 180 tablet 0  . Multiple Vitamin (MULTIVITAMIN WITH MINERALS) TABS tablet Take 1 tablet by mouth daily. 30 tablet 0  . omeprazole (PRILOSEC) 20 MG capsule Take 1 capsule (20 mg total) by mouth daily. 90 capsule 1  . ondansetron (  ZOFRAN-ODT) 8 MG disintegrating tablet Take 1 tablet (8 mg total) by mouth every 8 (eight) hours as needed for nausea. 30 tablet 1  . spironolactone (ALDACTONE) 100 MG tablet Take 100 mg by mouth daily.    Marland Kitchen thiamine 100 MG tablet Take 1 tablet (100 mg total) by mouth daily. 90 tablet 1   No current facility-administered medications on  file prior to visit.     ALLERGIES: Allergies  Allergen Reactions  . Amoxicillin Rash    FAMILY HISTORY: Family History  Problem Relation Age of Onset  . Cancer Mother        BREAST  . Heart disease Father   . Hyperlipidemia Father   . Cancer Father        PROSTATE  . Diabetes Maternal Uncle   . Diabetes Paternal Grandfather     SOCIAL HISTORY: Social History   Socioeconomic History  . Marital status: Married    Spouse name: Not on file  . Number of children: Not on file  . Years of education: Not on file  . Highest education level: Not on file  Occupational History  . Not on file  Social Needs  . Financial resource strain: Not on file  . Food insecurity:    Worry: Not on file    Inability: Not on file  . Transportation needs:    Medical: Not on file    Non-medical: Not on file  Tobacco Use  . Smoking status: Never Smoker  . Smokeless tobacco: Never Used  Substance and Sexual Activity  . Alcohol use: Yes    Alcohol/week: 24.0 - 25.8 oz    Types: 28 Cans of beer, 12 - 15 Standard drinks or equivalent per week    Comment: 4 12 oz beers per day  . Drug use: Never  . Sexual activity: Yes    Birth control/protection: None  Lifestyle  . Physical activity:    Days per week: Not on file    Minutes per session: Not on file  . Stress: Not on file  Relationships  . Social connections:    Talks on phone: Not on file    Gets together: Not on file    Attends religious service: Not on file    Active member of club or organization: Not on file    Attends meetings of clubs or organizations: Not on file    Relationship status: Not on file  . Intimate partner violence:    Fear of current or ex partner: Not on file    Emotionally abused: Not on file    Physically abused: Not on file    Forced sexual activity: Not on file  Other Topics Concern  . Not on file  Social History Narrative  . Not on file    REVIEW OF SYSTEMS: Constitutional: No fevers, chills, or  sweats, no generalized fatigue, change in appetite Eyes: No visual changes, double vision, eye pain Ear, nose and throat: No hearing loss, ear pain, nasal congestion, sore throat Cardiovascular: No chest pain, palpitations Respiratory:  No shortness of breath at rest or with exertion, wheezes GastrointestinaI: No nausea, vomiting, diarrhea, abdominal pain, fecal incontinence Genitourinary:  No dysuria, urinary retention or frequency Musculoskeletal:  No neck pain, back pain Integumentary: No rash, pruritus, skin lesions Neurological: as above Psychiatric: No depression, insomnia, anxiety Endocrine: No palpitations, fatigue, diaphoresis, mood swings, change in appetite, change in weight, increased thirst Hematologic/Lymphatic:  No anemia, purpura, petechiae. Allergic/Immunologic: no itchy/runny eyes, nasal congestion, recent allergic reactions,  rashes  PHYSICAL EXAM: Vitals:   09/20/17 1259  BP: 116/88  Pulse: 80  SpO2: 96%   General: No acute distress Head:  Normocephalic/atraumatic Eyes: Fundoscopic exam shows bilateral sharp discs, no vessel changes, exudates, or hemorrhages Neck: supple, no paraspinal tenderness, full range of motion Back: No paraspinal tenderness Heart: regular rate and rhythm Lungs: Clear to auscultation bilaterally. Vascular: No carotid bruits. Skin/Extremities: No rash, no edema Neurological Exam: Mental status: alert and oriented to person, place, and time, no dysarthria or aphasia, Fund of knowledge is appropriate.  Recent and remote memory are intact. 3/3 delayed recall.  Attention and concentration are normal.    Able to name objects and repeat phrases. Cranial nerves: CN I: not tested CN II: pupils equal, round and reactive to light, visual fields intact, fundi unremarkable. CN III, IV, VI:  full range of motion, no nystagmus, no ptosis CN V: facial sensation intact CN VII: upper and lower face symmetric CN VIII: hearing intact to finger rub CN IX,  X: gag intact, uvula midline CN XI: sternocleidomastoid and trapezius muscles intact CN XII: tongue midline Bulk & Tone: normal, no fasciculations. Motor: 5/5 throughout with no pronator drift. Sensation: intact to light touch, cold, pin, vibration and joint position sense.  No extinction to double simultaneous stimulation.  Romberg test negative Deep Tendon Reflexes: +2 throughout, no ankle clonus Plantar responses: downgoing bilaterally Cerebellar: no incoordination on finger to nose, heel to shin. No dysdiadochokinesia Gait: narrow-based and steady, able to tandem walk adequately. Tremor: none  IMPRESSION: This is a pleasant 32 year old right-handed man with a history of ADHD, GERD, in his usual state of health until April 2019 when he was admitted to [facility] for ascites. Extensive evaluation with GI indicates possible alcoholic hepatitis with ascites. Liver function and sodium levels normalized since he stopped drinking. He denies any alcohol intake since mid-April 2019. He had a witnessed seizure last 09/11/17 which occurred a few hours after increasing Wellbutrin dose from 150 to . His neurological exam is normal. Seizure may have been due to lowered seizure threshold from Wellbutrin, we discussed doing an MRI brain with and without contrast and a 1-hour sleep-deprived EEG to assess for focal abnormalities that increase risk for recurrent seizures. If normal, a 24-hour EEG will be ordered. We discussed avoidance of seizure triggers, including alcohol, sleep deprivation, he has stopped Wellbutrin.  driving laws were discussed with the patient, and he knows to stop driving after a seizure, until 6 months seizure-free. He will follow-up in 2 months and knows to call for any changes.   Thank you for allowing me to participate in the care of this patient. Please do not hesitate to call for any questions or concerns.   Patrcia Dolly, M.D.  CC: Dr. Sharee Holster, Dr. Jeraldine Loots

## 2017-09-24 DIAGNOSIS — Z8669 Personal history of other diseases of the nervous system and sense organs: Secondary | ICD-10-CM | POA: Insufficient documentation

## 2017-09-26 ENCOUNTER — Telehealth: Payer: Self-pay | Admitting: *Deleted

## 2017-09-26 ENCOUNTER — Telehealth: Payer: Self-pay | Admitting: Neurology

## 2017-09-26 DIAGNOSIS — R569 Unspecified convulsions: Secondary | ICD-10-CM

## 2017-09-26 NOTE — Telephone Encounter (Signed)
Patient has not heard anything from the MRI place to sch the MRI and it has been about a week they are wanting to check the status or get a phone number to where they can call to get it sch

## 2017-09-26 NOTE — Telephone Encounter (Signed)
caled and left message to call us if he would like a sooner appointment

## 2017-09-29 ENCOUNTER — Ambulatory Visit
Admission: RE | Admit: 2017-09-29 | Discharge: 2017-09-29 | Disposition: A | Discharge status: Home / Self Care | Payer: PRIVATE HEALTH INSURANCE | Source: Ambulatory Visit | Attending: Neurology | Admitting: Neurology

## 2017-09-29 DIAGNOSIS — R569 Unspecified convulsions: Secondary | ICD-10-CM

## 2017-09-30 ENCOUNTER — Telehealth: Payer: Self-pay | Admitting: Neurology

## 2017-09-30 ENCOUNTER — Other Ambulatory Visit: Payer: Self-pay

## 2017-09-30 DIAGNOSIS — R569 Unspecified convulsions: Secondary | ICD-10-CM

## 2017-09-30 NOTE — Telephone Encounter (Signed)
I don't see us prescribing any Valium for him, did he get any med for it? Was it an open MRI? Ok for Valium 10mg  x 1, may take second dose if needed, use open MRI. Thanks

## 2017-09-30 NOTE — Telephone Encounter (Signed)
Patient was unable to have MRI due to the Medication not being strong enough. He is needing a stronger medication or sedation. He also is needing to have the MRI rescheduled. [facility] uses [facility] on [address]. Please Call. Thanks

## 2017-09-30 NOTE — Telephone Encounter (Signed)
Rx below called in to [facility]

## 2017-09-30 NOTE — Telephone Encounter (Signed)
Order placed and faxed to [facility].

## 2017-10-03 ENCOUNTER — Other Ambulatory Visit: Payer: Self-pay | Admitting: Family Medicine

## 2017-10-03 DIAGNOSIS — F902 Attention-deficit hyperactivity disorder, combined type: Secondary | ICD-10-CM

## 2017-10-03 NOTE — Telephone Encounter (Signed)
Orders placed.

## 2017-10-03 NOTE — Telephone Encounter (Signed)
Please review for appropriateness of refill request.  T. Daveyon Kitchings, CMA 

## 2017-10-03 NOTE — Telephone Encounter (Addendum)
LMOM with centralized scheduling to return call and schedule MRI.

## 2017-10-03 NOTE — Telephone Encounter (Signed)
Ok for MRI brain with and without contrast under sedation. Thanks 

## 2017-10-07 ENCOUNTER — Ambulatory Visit (INDEPENDENT_AMBULATORY_CARE_PROVIDER_SITE_OTHER): Payer: PRIVATE HEALTH INSURANCE | Admitting: Neurology

## 2017-10-07 DIAGNOSIS — R569 Unspecified convulsions: Secondary | ICD-10-CM | POA: Diagnosis not present

## 2017-10-07 MED ORDER — METHYLPHENIDATE HCL 20 MG PO TABS: ORAL_TABLET | ORAL | 0 refills | Status: DC

## 2017-10-07 NOTE — Procedures (Signed)
ELECTROENCEPHALOGRAM REPORT  Date of Study: 10/07/2017  Patient's Name: Vincent [name] MRN: [id number] Date of Birth: 1985-12-08  Referring Provider: Dr. Patrcia DollyKaren Aquino  Clinical History: This is a 32 year old man with new onset seizure  Medications: FOLVITE 1 MG tablet  Melatonin 5 MG TABS  RITALIN 20 MG tablet  MULTIVITAMIN WITH MINERALS TABS   PRILOSEC 20 MG capsule  ZOFRAN-ODT 8 MG   ALDACTONE 100 MG tablet  thiamine 100 MG tablet  Technical Summary: A multichannel digital 1-hour sleep-deprived EEG recording measured by the international 10-20 system with electrodes applied with paste and impedances below 5000 ohms performed in our laboratory with EKG monitoring in an awake and asleep patient.  Hyperventilation and photic stimulation were performed.  The digital EEG was referentially recorded, reformatted, and digitally filtered in a variety of bipolar and referential montages for optimal display.    Description: The patient is awake and asleep during the recording.  During maximal wakefulness, there is a symmetric, medium voltage 11.5-12 Hz posterior dominant rhythm that attenuates with eye opening.  The record is symmetric.  During drowsiness and sleep, there is an increase in theta slowing of the background.  Vertex waves and symmetric sleep spindles were seen.  Hyperventilation and photic stimulation did not elicit any abnormalities.  There were no epileptiform discharges or electrographic seizures seen.    EKG lead was unremarkable.  Impression: This 1-hour awake and asleep EEG is normal.    Clinical Correlation: A normal EEG does not exclude a clinical diagnosis of epilepsy.  If further clinical questions remain, prolonged EEG may be helpful.  Clinical correlation is advised.   Patrcia DollyKaren Aquino, M.D.

## 2017-10-09 ENCOUNTER — Telehealth: Payer: Self-pay

## 2017-10-09 ENCOUNTER — Ambulatory Visit (INDEPENDENT_AMBULATORY_CARE_PROVIDER_SITE_OTHER): Payer: PRIVATE HEALTH INSURANCE | Admitting: Neurology

## 2017-10-09 DIAGNOSIS — R569 Unspecified convulsions: Secondary | ICD-10-CM | POA: Diagnosis not present

## 2017-10-09 NOTE — Telephone Encounter (Signed)
-----   Message from Van ClinesKaren M Aquino, MD sent at 10/07/2017 11:57 AM EDT ----- Pls let him know the EEG is normal, proceed with 24-hour EEG as planned. Thanks

## 2017-10-09 NOTE — Telephone Encounter (Signed)
Spoke with pt relaying message below.   

## 2017-10-14 NOTE — Procedures (Signed)
ELECTROENCEPHALOGRAM REPORT  Dates of Recording: 10/09/2017 1:22PM to 10/10/2017 1:36PM  Patient's Name: Vincent PenBrian Whittingham MRN: [id number] Date of Birth: [birth year]  Referring Provider: Dr. Patrcia DollyKaren Aquino  Procedure: 24-hour ambulatory EEG  History: This is a 32 year old man with new onset seizure.  Medications:  FOLVITE 1 MG tablet  LASIX 40 MG tablet ANUSOL-HC 2.5 % rectal cream  Melatonin 5 MG TABS .   RITALIN 20 MG tablet  MULTIVITAMIN WITH MINERALS TABS PRILOSEC 20 MG capsule  ZOFRAN-ODT 8 MG disintegrating tablet  ALDACTONE 100 MG tablet  thiamine 100 MG tablet  Technical Summary: This is a 24-hour multichannel digital EEG recording measured by the international 10-20 system with electrodes applied with paste and impedances below 5000 ohms performed as portable with EKG monitoring.  The digital EEG was referentially recorded, reformatted, and digitally filtered in a variety of bipolar and referential montages for optimal display.    DESCRIPTION OF RECORDING: During maximal wakefulness, the background activity consisted of a symmetric 10 Hz posterior dominant rhythm which was reactive to eye opening.  There were no epileptiform discharges or focal slowing seen in wakefulness.  During the recording, the patient progresses through wakefulness, drowsiness, and Stage 2 sleep.  Again, there were no epileptiform discharges seen.  Events: There were no push button events.  There were no electrographic seizures seen.  EKG lead was unremarkable.  IMPRESSION: This 24-hour ambulatory EEG study is normal.    CLINICAL CORRELATION: A normal EEG does not exclude a clinical diagnosis of epilepsy.  Typical events were not captured. If further clinical questions remain, inpatient video EEG monitoring may be helpful.   Patrcia DollyKaren Aquino, M.D.

## 2017-10-15 ENCOUNTER — Encounter: Payer: Self-pay | Admitting: Gastroenterology

## 2017-10-16 ENCOUNTER — Telehealth: Payer: Self-pay | Admitting: Neurology

## 2017-10-16 ENCOUNTER — Encounter (INDEPENDENT_AMBULATORY_CARE_PROVIDER_SITE_OTHER): Payer: Self-pay

## 2017-10-16 NOTE — Telephone Encounter (Signed)
Called cell listed, mailbox full and cannot accept messages. Will send message on MyChart

## 2017-10-17 ENCOUNTER — Telehealth: Payer: Self-pay | Admitting: *Deleted

## 2017-10-17 NOTE — Telephone Encounter (Signed)
Kristen,  This pt is cleared for anesthetic care at [facility].  Thanks,  Saivon Prowse 

## 2017-10-17 NOTE — Telephone Encounter (Signed)
John,  Could you please review this pt's chart?  He has been seen for a new onset seizure.  Just wanted to make sure ok for [facility].  Thanks, WPS ResourcesKristen

## 2017-10-23 ENCOUNTER — Encounter: Payer: Self-pay | Admitting: Gastroenterology

## 2017-10-23 ENCOUNTER — Other Ambulatory Visit: Payer: Self-pay

## 2017-10-23 ENCOUNTER — Ambulatory Visit (AMBULATORY_SURGERY_CENTER): Payer: PRIVATE HEALTH INSURANCE | Admitting: Gastroenterology

## 2017-10-23 ENCOUNTER — Encounter: Payer: PRIVATE HEALTH INSURANCE | Admitting: Gastroenterology

## 2017-10-23 VITALS — BP 100/67 | HR 73 | Temp 98.4°F | Resp 13 | Ht 68.0 in | Wt 167.0 lb

## 2017-10-23 DIAGNOSIS — R945 Abnormal results of liver function studies: Secondary | ICD-10-CM

## 2017-10-23 DIAGNOSIS — R188 Other ascites: Secondary | ICD-10-CM

## 2017-10-23 MED ORDER — SODIUM CHLORIDE 0.9 % IV SOLN: 500.0000 mL | Freq: Once | INTRAVENOUS | Status: DC

## 2017-10-23 NOTE — Patient Instructions (Signed)
YOU HAD AN ENDOSCOPIC PROCEDURE TODAY AT THE [facility]:   Refer to the procedure report that was given to you for any specific questions about what was found during the examination.  If the procedure report does not answer your questions, please call your gastroenterologist to clarify.  If you requested that your care partner not be given the details of your procedure findings, then the procedure report has been included in a sealed envelope for you to review at your convenience later.  YOU SHOULD EXPECT: Some feelings of bloating in the abdomen. Passage of more gas than usual.  Walking can help get rid of the air that was put into your GI tract during the procedure and reduce the bloating. If you had a lower endoscopy (such as a colonoscopy or flexible sigmoidoscopy) you may notice spotting of blood in your stool or on the toilet paper. If you underwent a bowel prep for your procedure, you may not have a normal bowel movement for a few days.  Please Note:  You might notice some irritation and congestion in your nose or some drainage.  This is from the oxygen used during your procedure.  There is no need for concern and it should clear up in a day or so.  SYMPTOMS TO REPORT IMMEDIATELY:   Following upper endoscopy (EGD)  Vomiting of blood or coffee ground material  New chest pain or pain under the shoulder blades  Painful or persistently difficult swallowing  New shortness of breath  Fever of 100F or higher  Black, tarry-looking stools  For urgent or emergent issues, a gastroenterologist can be reached at any hour by calling [phone].   DIET:  We do recommend a small meal at first, but then you may proceed to your regular diet.  Drink plenty of fluids but you should avoid alcoholic beverages for 24 hours.  ACTIVITY:  You should plan to take it easy for the rest of today and you should NOT DRIVE or use heavy machinery until tomorrow (because of the sedation medicines used  during the test).    FOLLOW UP: Our staff will call the number listed on your records the next business day following your procedure to check on you and address any questions or concerns that you may have regarding the information given to you following your procedure. If we do not reach you, we will leave a message.  However, if you are feeling well and you are not experiencing any problems, there is no need to return our call.  We will assume that you have returned to your regular daily activities without incident.  If any biopsies were taken you will be contacted by phone or by letter within the next 1-3 weeks.  Please call us at [phone] if you have not heard about the biopsies in 3 weeks.    SIGNATURES/CONFIDENTIALITY: You and/or your care partner have signed paperwork which will be entered into your electronic medical record.  These signatures attest to the fact that that the information above on your After Visit Summary has been reviewed and is understood.  Full responsibility of the confidentiality of this discharge information lies with you and/or your care-partner. 

## 2017-10-23 NOTE — Progress Notes (Signed)
Report to PACU, RN, vss, BBS= Clear.  

## 2017-10-23 NOTE — Op Note (Signed)
[facility] Patient Name: Vincent [name] Procedure Date: 10/23/2017 10:21 AM MRN: [id number] Endoscopist: Viviann Spare P. Adela Lank , MD Age: 32 Referring MD:  Date of Birth: January 15, 1986 Gender: Male Account #: [id number] Procedure:                Upper GI endoscopy Indications:              history of alcoholic hepatitis, possible cirrhosis,                            rule out varices. patient endorses increased                            belching recently Medicines:                Monitored Anesthesia Care Procedure:                Pre-Anesthesia Assessment:                           - Prior to the procedure, a History and Physical                            was performed, and patient medications and                            allergies were reviewed. The patient's tolerance of                            previous anesthesia was also reviewed. The risks                            and benefits of the procedure and the sedation                            options and risks were discussed with the patient.                            All questions were answered, and informed consent                            was obtained. Prior Anticoagulants: The patient has                            taken no previous anticoagulant or antiplatelet                            agents. ASA Grade Assessment: III - A patient with                            severe systemic disease. After reviewing the risks                            and benefits, the patient was deemed in  satisfactory condition to undergo the procedure.                           After obtaining informed consent, the endoscope was                            passed under direct vision. Throughout the                            procedure, the patient's blood pressure, pulse, and                            oxygen saturations were monitored continuously. The                            Endoscope was introduced through  the mouth, and                            advanced to the second part of duodenum. The upper                            GI endoscopy was accomplished without difficulty.                            The patient tolerated the procedure well. Scope In: Scope Out: Findings:                 Esophagogastric landmarks were identified: the                            Z-line was found at 38 cm, the gastroesophageal                            junction was found at 38 cm and the upper extent of                            the gastric folds was found at 38 cm from the                            incisors.                           The exam of the esophagus was otherwise normal,                            with exception of one possible very faint column of                            varix. It easily flattened and no stigmata for                            bleeding                           Retained  fluid and pill fragments with some                            residual food was found in the gastric body. It                            could not be cleared                           The exam of the stomach was otherwise normal.                           The duodenal bulb and second portion of the                            duodenum were normal. Complications:            No immediate complications. Estimated blood loss:                            None. Estimated Blood Loss:     Estimated blood loss: none. Impression:               - Esophagogastric landmarks identified.                           - Questionable one faint column of varix, otherwise                            normal. No high risk pathology                           - Retained gastric fluid / food without evidence of                            outlet obstruction, concerning for possible                            gastroparesis.                           - Normal duodenal bulb and second portion of the                             duodenum. Recommendation:           - Patient has a contact number available for                            emergencies. The signs and symptoms of potential                            delayed complications were discussed with the                            patient. Return to normal activities tomorrow.  Written discharge instructions were provided to the                            patient.                           - Resume previous diet.                           - Continue present medications.                           - Taper off diuretics as discussed                           - Consideration for gastric emptying study, will                            discuss with patient                           - Follow up in the office in a few months for                            reassessment. Consideration for liver biopsy to                            more definitively rule out cirrhosis Viviann Spare P. Armbruster, MD 10/23/2017 10:44:55 AM This report has been signed electronically.

## 2017-10-25 ENCOUNTER — Telehealth: Payer: Self-pay

## 2017-10-25 NOTE — Telephone Encounter (Signed)
Called pt.  No answer.  LMOM advising of message below.  Asked for return call

## 2017-10-25 NOTE — Telephone Encounter (Signed)
-----   Message from Van ClinesKaren M Aquino, MD sent at 10/25/2017  8:56 AM EDT ----- Regarding: exam Pls let Dr. Sherryl BartersBudzyn know that unfortunately for MRI under sedation, I need to do an exam on him 30 days before. I can see him on any lunch day he can come just for a quick physical exam before his 7/16 MRI (except Thurs). Thanks

## 2017-10-28 ENCOUNTER — Telehealth: Payer: Self-pay | Admitting: *Deleted

## 2017-10-28 ENCOUNTER — Telehealth: Payer: Self-pay

## 2017-10-28 NOTE — Telephone Encounter (Signed)
  Follow up Call-  No flowsheet data found.  [phone] is the number called; no message left

## 2017-10-28 NOTE — Telephone Encounter (Signed)
Left message

## 2017-10-31 ENCOUNTER — Other Ambulatory Visit: Payer: Self-pay | Admitting: Family Medicine

## 2017-10-31 DIAGNOSIS — F902 Attention-deficit hyperactivity disorder, combined type: Secondary | ICD-10-CM

## 2017-10-31 NOTE — Telephone Encounter (Signed)
Patient last seen on 09/10/17 and has follow up on 12/03/17.  Medication last filled 10/07/17: insurance will only cover 30 days. MPulliam, CMA/RT(R)

## 2017-11-01 ENCOUNTER — Encounter: Payer: Self-pay | Admitting: Neurology

## 2017-11-01 ENCOUNTER — Ambulatory Visit (INDEPENDENT_AMBULATORY_CARE_PROVIDER_SITE_OTHER): Payer: PRIVATE HEALTH INSURANCE | Admitting: Neurology

## 2017-11-01 VITALS — BP 110/84 | HR 94 | Ht 67.0 in | Wt 176.2 lb

## 2017-11-01 DIAGNOSIS — R569 Unspecified convulsions: Secondary | ICD-10-CM

## 2017-11-01 NOTE — Progress Notes (Signed)
NEUROLOGY FOLLOW UP OFFICE NOTE  Vincent [name] [id number] Jul 25, 1985  HISTORY OF PRESENT ILLNESS: I had the pleasure of seeing Vincent [name] in follow-up in the neurology clinic on 11/01/2017. He is accompanied by his wife who helps supplement the history today. The patient was last seen 6 weeks ago after new onset seizure last 09/11/2017. Records and images were personally reviewed where available. His 1-hour sleep-deprived EEG and 24-hour EEG were normal. He denies any further seizures or seizure-like symptoms since 09/11/17. He and his wife deny any staring/unresponsive episodes, gaps in time, olfactory/gustatory hallucinations, focal numbness/tingling/weakness, myoclonic jerks. No headaches, dizziness, vision changes, no falls. He denies any chest pain or shortness of breath. He had a recent EGD which was reportedly normal except for some gastroparesis. He has not had any alcohol since mid-April. He is not driving. He has been back to work with restrictions since Tuesday. He is scheduled for an MRI under sedation on 11/05/17.  HPI 09/20/2017: This is a pleasant 32 yo RH man with a history of ADHD, GERD, in his usual state of health until April 2019 when he was admitted to [facility] for ascites. Extensive evaluation with GI indicates possible alcoholic hepatitis with ascites. Liver function and sodium levels normalized since he stopped drinking. He denies any alcohol intake since hospitalization in mid-April. He saw his PCP in follow-up on 09/10/17, he had been taking Wellbutrin XL 150mg  daily for the past month, dose was increased to 300mg . He took the first dose around 7am on 09/11/17, then had a seizure while in clinic at around 9am. He denies any prior warning symptoms. He recalls feeling tired that day and may have been sleep deprived. The beginning of the seizure was not witnessed by co-workers, they saw him having a convulsion that lasted 1.5 minutes. He bit his lip, no incontinence. He recalls waking up on  the floor and was argumentative, refusing to come with EMS. His wife came and brought him to the ER, he vomited en route. He denied any focal weakness, no headache.  No prior history of seizures. He was instructed to stop Wellbutrin. He had a head CT which I personally reviewed, no acute changes seen. Bloodwork showed mildly elevated WBC 10.9, mildly elevated creatinine 1.39, normal AST, ALT, EtOH level <10. He may have been sleep deprived the night prior. He denies any staring/unresponsive episodes, gaps in time, olfactory/gustatory hallucinations, deja vu, rising epigastric sensation, focal numbness/tingling/weakness, myoclonic jerks. He denies any headaches, dizziness, diplopia, dysarthria/dysphagia, neck/back pain, bowel/bladder dysfunction. He recalls a concussion in childhood, no prolonged loss of consciousness. He had a normal birth and early development.  There is no history of febrile convulsions, CNS infections such as meningitis/encephalitis, significant traumatic brain injury, neurosurgical procedures, or family history of seizures  PAST MEDICAL HISTORY: Past Medical History:  Diagnosis Date  . ADHD   . Cirrhosis (HCC)   . GERD (gastroesophageal reflux disease)     MEDICATIONS: Current Outpatient Medications on File Prior to Visit  Medication Sig Dispense Refill  . folic acid (FOLVITE) 1 MG tablet Take 1 tablet (1 mg total) by mouth daily. 90 tablet 1  . furosemide (LASIX) 40 MG tablet Take 40 mg by mouth daily.    . hydrocortisone (ANUSOL-HC) 2.5 % rectal cream Place 1 application rectally at bedtime. Use for 7 nights 30 g 1  . Melatonin 5 MG TABS Take 5 mg by mouth at bedtime as needed (for sleep).    . methylphenidate (RITALIN) 20 MG tablet 1 tab  in the a.m. and 1 q. afternoon 180 tablet 0  . Multiple Vitamin (MULTIVITAMIN WITH MINERALS) TABS tablet Take 1 tablet by mouth daily. 30 tablet 0  . omeprazole (PRILOSEC) 20 MG capsule Take 1 capsule (20 mg total) by mouth daily. 90  capsule 1  . ondansetron (ZOFRAN-ODT) 8 MG disintegrating tablet Take 1 tablet (8 mg total) by mouth every 8 (eight) hours as needed for nausea. 30 tablet 1  . spironolactone (ALDACTONE) 100 MG tablet Take 100 mg by mouth daily.    Marland Kitchen. thiamine 100 MG tablet Take 1 tablet (100 mg total) by mouth daily. 90 tablet 1   Current Facility-Administered Medications on File Prior to Visit  Medication Dose Route Frequency Provider Last Rate Last Dose  . 0.9 %  sodium chloride infusion  500 mL Intravenous Once Armbruster, Willaim RayasSteven P, MD        ALLERGIES: Allergies  Allergen Reactions  . Amoxicillin Rash    FAMILY HISTORY: Family History  Problem Relation Age of Onset  . Cancer Mother        BREAST  . Heart disease Father   . Hyperlipidemia Father   . Cancer Father        PROSTATE  . Diabetes Maternal Uncle   . Diabetes Paternal Grandfather     SOCIAL HISTORY: Social History   Socioeconomic History  . Marital status: Married    Spouse name: Not on file  . Number of children: Not on file  . Years of education: Not on file  . Highest education level: Not on file  Occupational History  . Not on file  Social Needs  . Financial resource strain: Not on file  . Food insecurity:    Worry: Not on file    Inability: Not on file  . Transportation needs:    Medical: Not on file    Non-medical: Not on file  Tobacco Use  . Smoking status: Never Smoker  . Smokeless tobacco: Never Used  Substance and Sexual Activity  . Alcohol use: Yes    Alcohol/week: 24.0 - 25.8 oz    Types: 28 Cans of beer, 12 - 15 Standard drinks or equivalent per week    Comment: 4 12 oz beers per day- none sine 07/2017  . Drug use: Never  . Sexual activity: Yes    Birth control/protection: None  Lifestyle  . Physical activity:    Days per week: Not on file    Minutes per session: Not on file  . Stress: Not on file  Relationships  . Social connections:    Talks on phone: Not on file    Gets together: Not on  file    Attends religious service: Not on file    Active member of club or organization: Not on file    Attends meetings of clubs or organizations: Not on file    Relationship status: Not on file  . Intimate partner violence:    Fear of current or ex partner: Not on file    Emotionally abused: Not on file    Physically abused: Not on file    Forced sexual activity: Not on file  Other Topics Concern  . Not on file  Social History Narrative   Pt lives in 2 story home with his wife   has Medical Degree; urologist     REVIEW OF SYSTEMS: Constitutional: No fevers, chills, or sweats, no generalized fatigue, change in appetite Eyes: No visual changes, double vision, eye pain Ear, nose and  throat: No hearing loss, ear pain, nasal congestion, sore throat Cardiovascular: No chest pain, palpitations Respiratory:  No shortness of breath at rest or with exertion, wheezes GastrointestinaI: No nausea, vomiting, diarrhea, abdominal pain, fecal incontinence Genitourinary:  No dysuria, urinary retention or frequency Musculoskeletal:  No neck pain, back pain Integumentary: No rash, pruritus, skin lesions Neurological: as above Psychiatric: No depression, insomnia, anxiety Endocrine: No palpitations, fatigue, diaphoresis, mood swings, change in appetite, change in weight, increased thirst Hematologic/Lymphatic:  No anemia, purpura, petechiae. Allergic/Immunologic: no itchy/runny eyes, nasal congestion, recent allergic reactions, rashes  PHYSICAL EXAM: Vitals:   11/01/17 1222  BP: 110/84  Pulse: 94  SpO2: 98%   General: No acute distress Head:  Normocephalic/atraumatic Neck: supple, no paraspinal tenderness, full range of motion Heart:  Regular rate and rhythm Lungs:  Clear to auscultation bilaterally Back: No paraspinal tenderness Skin/Extremities: No rash, no edema Neurological Exam: alert and oriented to person, place, and time. No aphasia or dysarthria. Fund of knowledge is appropriate.   Recent and remote memory are intact.  Attention and concentration are normal.    Able to name objects and repeat phrases. Cranial nerves: Pupils equal, round, reactive to light. Extraocular movements intact with no nystagmus. Visual fields full. Facial sensation intact. No facial asymmetry. Tongue, uvula, palate midline.  Motor: Bulk and tone normal, muscle strength 5/5 throughout with no pronator drift.  Sensation to light touch intact.  No extinction to double simultaneous stimulation.  Deep tendon reflexes 2+ throughout, toes downgoing.  Finger to nose testing intact.  Gait narrow-based and steady, able to tandem walk adequately.  Romberg negative.  IMPRESSION: This is a pleasant 32 yo RH man with a history of ADHD, GERD, in his usual state of health until April 2019 when he was admitted to [facility] for ascites. Extensive evaluation with GI indicates possible alcoholic hepatitis with ascites. Liver function and sodium levels normalized since he stopped drinking. He denies any alcohol intake since mid-April 2019. He had a witnessed seizure last 09/11/17 which occurred a few hours after increasing Wellbutrin dose from 150 to 300mg . His neurological exam is normal. His routine and 24-hour EEG were normal. He is scheduled for an MRI brain this week. No seizures since May 2019. Seizure may have been due to lowered seizure threshold from Wellbutrin. We again discussed risks of seizures and driving and return to work, [place] driving laws indicate a person be 6 months seizure-free before return to driving. He will follow-up in 2 months and knows to call for any changes  Thank you for allowing me to participate in his care.  Please do not hesitate to call for any questions or concerns.  The duration of this appointment visit was 15 minutes of face-to-face time with the patient.  Greater than 50% of this time was spent in counseling, explanation of diagnosis, planning of further management, and coordination of  care.   Patrcia Dolly, M.D.   CC: Dr. Sharee Holster

## 2017-11-01 NOTE — Patient Instructions (Addendum)
1. Proceed with MRI brain as scheduled 2. Follow-up in September, call for any changes  Seizure Precautions: 1. If medication has been prescribed for you to prevent seizures, take it exactly as directed.  Do not stop taking the medicine without talking to your doctor first, even if you have not had a seizure in a long time.   2. Avoid activities in which a seizure would cause danger to yourself or to others.  Don't operate dangerous machinery, swim alone, or climb in high or dangerous places, such as on ladders, roofs, or girders.  Do not drive unless your doctor says you may.  3. If you have any warning that you may have a seizure, lay down in a safe place where you can't hurt yourself.    4.  No driving for 6 months from last seizure, as per [facility] [place].   Please refer to the following link on the [employer]ile[employer] of [employer]'s website for more information: http://www.epilepsyfoundation.org/answerplace/Social/driving/drivingu.cfm   5.  Maintain good sleep hygiene. Avoid alcohol.  6.  Contact your doctor if you have any problems that may be related to the medicine you are taking.  7.  Call 911 and bring the patient back to the ED if:        A.  The seizure lasts longer than 5 minutes.       B.  The patient doesn't awaken shortly after the seizure  C.  The patient has new problems such as difficulty seeing, speaking or moving  D.  The patient was injured during the seizure  E.  The patient has a temperature over 102 F (39C)  F.  The patient vomited and now is having trouble breathing

## 2017-11-04 ENCOUNTER — Other Ambulatory Visit: Payer: Self-pay

## 2017-11-04 ENCOUNTER — Encounter (HOSPITAL_COMMUNITY): Payer: Self-pay | Admitting: *Deleted

## 2017-11-04 MED ORDER — METHYLPHENIDATE HCL 20 MG PO TABS: ORAL_TABLET | ORAL | 0 refills | Status: DC

## 2017-11-04 NOTE — Progress Notes (Signed)
I left a message on Vincent HaggisBrian [name]'s phone informing that I was calling him for a pre- procedure call.  I received a message back from patient stating that his health history should be up to date and if I had any instructions for him that I could leave a voice message.   I called and got voice message and left instructions.  I instructed the patient to [facility] entrance at 5:30 AM  , nothing to eat or drink after midnight.   I instructed the patient to take the following medications in the am with just enough water to get them down: Omeprazole and Zofran if needed. I asked patient to not wear any lotions, powders, cologne, jewelry, piercing.  I asked the patient to call [phone], in the am if there were any questions or problems. I instructed him that he will need someone to drive him home and to keep an eye on him for the first 24 hours after anesthesia.

## 2017-11-05 ENCOUNTER — Ambulatory Visit (HOSPITAL_COMMUNITY)
Admission: AD | Admit: 2017-11-05 | Discharge: 2017-11-05 | Disposition: A | Discharge status: Home / Self Care | Payer: PRIVATE HEALTH INSURANCE | Source: Ambulatory Visit | Attending: Neurology | Admitting: Neurology

## 2017-11-05 ENCOUNTER — Ambulatory Visit (HOSPITAL_COMMUNITY): Payer: PRIVATE HEALTH INSURANCE | Admitting: Anesthesiology

## 2017-11-05 ENCOUNTER — Encounter (HOSPITAL_COMMUNITY): Payer: Self-pay | Admitting: *Deleted

## 2017-11-05 ENCOUNTER — Encounter (HOSPITAL_COMMUNITY)
Admission: AD | Disposition: A | Discharge status: Home / Self Care | Payer: Self-pay | Source: Ambulatory Visit | Attending: Neurology

## 2017-11-05 ENCOUNTER — Ambulatory Visit (HOSPITAL_COMMUNITY)
Admission: RE | Admit: 2017-11-05 | Discharge: 2017-11-05 | Disposition: A | Discharge status: Home / Self Care | Payer: PRIVATE HEALTH INSURANCE | Source: Ambulatory Visit | Attending: Neurology | Admitting: Neurology

## 2017-11-05 DIAGNOSIS — Z79899 Other long term (current) drug therapy: Secondary | ICD-10-CM | POA: Diagnosis not present

## 2017-11-05 DIAGNOSIS — F40240 Claustrophobia: Secondary | ICD-10-CM | POA: Insufficient documentation

## 2017-11-05 DIAGNOSIS — Z8249 Family history of ischemic heart disease and other diseases of the circulatory system: Secondary | ICD-10-CM | POA: Insufficient documentation

## 2017-11-05 DIAGNOSIS — F909 Attention-deficit hyperactivity disorder, unspecified type: Secondary | ICD-10-CM | POA: Insufficient documentation

## 2017-11-05 DIAGNOSIS — Z881 Allergy status to other antibiotic agents status: Secondary | ICD-10-CM | POA: Diagnosis not present

## 2017-11-05 DIAGNOSIS — F329 Major depressive disorder, single episode, unspecified: Secondary | ICD-10-CM | POA: Insufficient documentation

## 2017-11-05 DIAGNOSIS — Z8042 Family history of malignant neoplasm of prostate: Secondary | ICD-10-CM | POA: Diagnosis not present

## 2017-11-05 DIAGNOSIS — R569 Unspecified convulsions: Secondary | ICD-10-CM

## 2017-11-05 DIAGNOSIS — Z803 Family history of malignant neoplasm of breast: Secondary | ICD-10-CM | POA: Diagnosis not present

## 2017-11-05 DIAGNOSIS — Z833 Family history of diabetes mellitus: Secondary | ICD-10-CM | POA: Insufficient documentation

## 2017-11-05 DIAGNOSIS — F419 Anxiety disorder, unspecified: Secondary | ICD-10-CM | POA: Insufficient documentation

## 2017-11-05 DIAGNOSIS — K219 Gastro-esophageal reflux disease without esophagitis: Secondary | ICD-10-CM | POA: Insufficient documentation

## 2017-11-05 DIAGNOSIS — K7011 Alcoholic hepatitis with ascites: Secondary | ICD-10-CM | POA: Diagnosis present

## 2017-11-05 HISTORY — DX: Anxiety disorder, unspecified: F41.9

## 2017-11-05 HISTORY — PX: RADIOLOGY WITH ANESTHESIA: SHX6223

## 2017-11-05 HISTORY — DX: Unspecified convulsions: R56.9

## 2017-11-05 HISTORY — DX: Depression, unspecified: F32.A

## 2017-11-05 HISTORY — DX: Concussion with loss of consciousness status unknown, initial encounter: S06.0XAA

## 2017-11-05 HISTORY — DX: Major depressive disorder, single episode, unspecified: F32.9

## 2017-11-05 HISTORY — DX: Concussion with loss of consciousness of unspecified duration, initial encounter: S06.0X9A

## 2017-11-05 LAB — CBC
HEMATOCRIT: 40.1 % (ref 39.0–52.0)
Hemoglobin: 13.1 g/dL (ref 13.0–17.0)
MCH: 30.4 pg (ref 26.0–34.0)
MCHC: 32.7 g/dL (ref 30.0–36.0)
MCV: 93 fL (ref 78.0–100.0)
PLATELETS: 208 10*3/uL (ref 150–400)
RBC: 4.31 MIL/uL (ref 4.22–5.81)
RDW: 15.6 % — AB (ref 11.5–15.5)
WBC: 5.7 10*3/uL (ref 4.0–10.5)

## 2017-11-05 LAB — BASIC METABOLIC PANEL
Anion gap: 9 (ref 5–15)
BUN: 9 mg/dL (ref 6–20)
CALCIUM: 9.4 mg/dL (ref 8.9–10.3)
CO2: 22 mmol/L (ref 22–32)
Chloride: 107 mmol/L (ref 98–111)
Creatinine, Ser: 0.91 mg/dL (ref 0.61–1.24)
GFR calc Af Amer: >60 mL/min (ref >60)
GFR calc non Af Amer: >60 mL/min (ref >60)
Glucose, Bld: 87 mg/dL (ref 70–99)
Potassium: 3.8 mmol/L (ref 3.5–5.1)
Sodium: 138 mmol/L (ref 135–145)

## 2017-11-05 SURGERY — MRI WITH ANESTHESIA
Anesthesia: General

## 2017-11-05 MED ORDER — GADOBENATE DIMEGLUMINE 529 MG/ML IV SOLN
15.0000 mL | Freq: Once | INTRAVENOUS | Status: AC
  Administered 2017-11-05: 15 mL via INTRAVENOUS

## 2017-11-05 MED ORDER — LACTATED RINGERS IV SOLN
INTRAVENOUS | Status: DC
  Administered 2017-11-05: 07:00:00 via INTRAVENOUS

## 2017-11-05 NOTE — H&P (Signed)
HISTORY OF PRESENT ILLNESS: This is a 32 year old right-handed man with new onset seizure last 09/11/2017. He tried to do an MRI with Valium but could not proceed due to significant claustrophobia. He is having an MRI under sedation to assess for focal abnormalities that increase risk for recurrent seizures. No denies any headaches, dizziness, vision changes, no falls. He denies any chest pain or shortness of breath. He had a recent EGD which was reportedly normal except for some gastroparesis. He has not had any alcohol since mid-April.   PAST MEDICAL HISTORY:     Past Medical History:  Diagnosis Date  . ADHD   . Cirrhosis (HCC)   . GERD (gastroesophageal reflux disease)     MEDICATIONS:       Current Outpatient Medications on File Prior to Visit  Medication Sig Dispense Refill  . folic acid (FOLVITE) 1 MG tablet Take 1 tablet (1 mg total) by mouth daily. 90 tablet 1  . furosemide (LASIX) 40 MG tablet Take 40 mg by mouth daily.    . hydrocortisone (ANUSOL-HC) 2.5 % rectal cream Place 1 application rectally at bedtime. Use for 7 nights 30 g 1  . Melatonin 5 MG TABS Take 5 mg by mouth at bedtime as needed (for sleep).    . methylphenidate (RITALIN) 20 MG tablet 1 tab in the a.m. and 1 q. afternoon 180 tablet 0  . Multiple Vitamin (MULTIVITAMIN WITH MINERALS) TABS tablet Take 1 tablet by mouth daily. 30 tablet 0  . omeprazole (PRILOSEC) 20 MG capsule Take 1 capsule (20 mg total) by mouth daily. 90 capsule 1  . ondansetron (ZOFRAN-ODT) 8 MG disintegrating tablet Take 1 tablet (8 mg total) by mouth every 8 (eight) hours as needed for nausea. 30 tablet 1  . spironolactone (ALDACTONE) 100 MG tablet Take 100 mg by mouth daily.    Marland Kitchen thiamine 100 MG tablet Take 1 tablet (100 mg total) by mouth daily. 90 tablet 1            Current Facility-Administered Medications on File Prior to Visit  Medication Dose Route Frequency Provider Last Rate Last Dose  . 0.9 %  sodium chloride infusion   500 mL Intravenous Once Armbruster, Willaim Rayas, MD        ALLERGIES:     Allergies  Allergen Reactions  . Amoxicillin Rash    FAMILY HISTORY:      Family History  Problem Relation Age of Onset  . Cancer Mother        BREAST  . Heart disease Father   . Hyperlipidemia Father   . Cancer Father        PROSTATE  . Diabetes Maternal Uncle   . Diabetes Paternal Grandfather     SOCIAL HISTORY: Social History        Socioeconomic History  . Marital status: Married    Spouse name: Not on file  . Number of children: Not on file  . Years of education: Not on file  . Highest education level: Not on file  Occupational History  . Not on file  Social Needs  . Financial resource strain: Not on file  . Food insecurity:    Worry: Not on file    Inability: Not on file  . Transportation needs:    Medical: Not on file    Non-medical: Not on file  Tobacco Use  . Smoking status: Never Smoker  . Smokeless tobacco: Never Used  Substance and Sexual Activity  . Alcohol use:  Yes    Alcohol/week: 24.0 - 25.8 oz    Types: 28 Cans of beer, 12 - 15 Standard drinks or equivalent per week    Comment: 4 12 oz beers per day- none sine 07/2017  . Drug use: Never  . Sexual activity: Yes    Birth control/protection: None  Lifestyle  . Physical activity:    Days per week: Not on file    Minutes per session: Not on file  . Stress: Not on file  Relationships  . Social connections:    Talks on phone: Not on file    Gets together: Not on file    Attends religious service: Not on file    Active member of club or organization: Not on file    Attends meetings of clubs or organizations: Not on file    Relationship status: Not on file  . Intimate partner violence:    Fear of current or ex partner: Not on file    Emotionally abused: Not on file    Physically abused: Not on file    Forced sexual activity: Not on file  Other Topics Concern  .  Not on file  Social History Narrative   Pt lives in 2 story home with his wife   has Medical Degree; urologist     REVIEW OF SYSTEMS: Constitutional: No fevers, chills, or sweats, no generalized fatigue, change in appetite Eyes: No visual changes, double vision, eye pain Ear, nose and throat: No hearing loss, ear pain, nasal congestion, sore throat Cardiovascular: No chest pain, palpitations Respiratory:  No shortness of breath at rest or with exertion, wheezes GastrointestinaI: No nausea, vomiting, diarrhea, abdominal pain, fecal incontinence Genitourinary:  No dysuria, urinary retention or frequency Musculoskeletal:  No neck pain, back pain Integumentary: No rash, pruritus, skin lesions Neurological: as above Psychiatric: No depression, insomnia, anxiety Endocrine: No palpitations, fatigue, diaphoresis, mood swings, change in appetite, change in weight, increased thirst Hematologic/Lymphatic:  No anemia, purpura, petechiae. Allergic/Immunologic: no itchy/runny eyes, nasal congestion, recent allergic reactions, rashes  PHYSICAL EXAM from office visit 11/01/2017:    Vitals:   11/01/17 1222  BP: 110/84  Pulse: 94  SpO2: 98%   General: No acute distress Head:  Normocephalic/atraumatic Neck: supple, no paraspinal tenderness, full range of motion Heart:  Regular rate and rhythm Lungs:  Clear to auscultation bilaterally Back: No paraspinal tenderness Skin/Extremities: No rash, no edema Neurological Exam: alert and oriented to person, place, and time. No aphasia or dysarthria. Fund of knowledge is appropriate.  Recent and remote memory are intact.  Attention and concentration are normal.    Able to name objects and repeat phrases. Cranial nerves: Pupils equal, round, reactive to light. Extraocular movements intact with no nystagmus. Visual fields full. Facial sensation intact. No facial asymmetry. Tongue, uvula, palate midline.  Motor: Bulk and tone normal, muscle strength 5/5  throughout with no pronator drift.  Sensation to light touch intact.  No extinction to double simultaneous stimulation.  Deep tendon reflexes 2+ throughout, toes downgoing.  Finger to nose testing intact.  Gait narrow-based and steady, able to tandem walk adequately.  Romberg negative.  IMPRESSION: This is apleasant 31yo RHmanwith a history of ADHD, GERD, in his usual state of health until April 2019 when he was admitted to [facility] for ascites. Extensive evaluation with GI indicates possible alcoholic hepatitis with ascites. Liver function and sodium levels normalized since he stopped drinking.He denies any alcohol intake since mid-April 2019. Recent EGD unremarkable. He had a witnessed seizure last  09/11/17 which occurred a few hours after increasing Wellbutrin dose from 150 to 300mg . His neurological exam is normal. His routine and 24-hour EEG were normal. He is significantly claustrophobic and will be undergoing an MRI under sedation to assess for focal abnormalities that increase risk for recurrent seizures.     Patrcia DollyKaren Kamylah Manzo, M.D.

## 2017-11-05 NOTE — Discharge Instructions (Signed)

## 2017-11-05 NOTE — Transfer of Care (Signed)
Immediate Anesthesia Transfer of Care Note  Patient: Vincent [name]  Procedure(s) Performed: MRI WITH ANESTHESIAMRI OF BRAIN WITH AND WITHOUT CONTRAST (N/A )  Patient Location: PACU  Anesthesia Type:General  Level of Consciousness: awake, alert , oriented and patient cooperative  Airway & Oxygen Therapy: Patient Spontanous Breathing and Patient connected to nasal cannula oxygen  Post-op Assessment: Report given to RN, Post -op Vital signs reviewed and stable and Patient moving all extremities X 4  Post vital signs: Reviewed and stable  Last Vitals:  Vitals Value Taken Time  BP 114/79 11/05/2017  9:26 AM  Temp    Pulse 95 11/05/2017  9:29 AM  Resp 21 11/05/2017  9:29 AM  SpO2 100 % 11/05/2017  9:29 AM  Vitals shown include unvalidated device data.  Last Pain:  Vitals:   11/05/17 0700  TempSrc:   PainSc: 0-No pain      Patients Stated Pain Goal: 4 (11/05/17 0700)  Complications: No apparent anesthesia complications

## 2017-11-05 NOTE — Anesthesia Preprocedure Evaluation (Signed)
Anesthesia Evaluation  Patient identified by MRN, date of birth, ID band Patient awake    Reviewed: Allergy & Precautions, NPO status , Patient's Chart, lab work & pertinent test results  History of Anesthesia Complications Negative for: history of anesthetic complications  Airway Mallampati: II  TM Distance: >3 FB Neck ROM: Full    Dental  (+) Teeth Intact   Pulmonary neg pulmonary ROS,    breath sounds clear to auscultation       Cardiovascular negative cardio ROS   Rhythm:Regular     Neuro/Psych Seizures -,  PSYCHIATRIC DISORDERS Anxiety Depression Seizure x1    GI/Hepatic GERD  Medicated and Controlled,(+) Cirrhosis       , Hepatitis -Etoh hepatitis with ascitis 3/19   Endo/Other  negative endocrine ROS  Renal/GU negative Renal ROS     Musculoskeletal   Abdominal   Peds  Hematology negative hematology ROS (+)   Anesthesia Other Findings   Reproductive/Obstetrics                             Anesthesia Physical Anesthesia Plan  ASA: II  Anesthesia Plan: General   Post-op Pain Management:    Induction: Intravenous  PONV Risk Score and Plan: 2 and Ondansetron and Treatment may vary due to age or medical condition  Airway Management Planned: LMA  Additional Equipment: None  Intra-op Plan:   Post-operative Plan: Extubation in OR  Informed Consent: I have reviewed the patients History and Physical, chart, labs and discussed the procedure including the risks, benefits and alternatives for the proposed anesthesia with the patient or authorized representative who has indicated his/her understanding and acceptance.   Dental advisory given  Plan Discussed with: CRNA and Surgeon  Anesthesia Plan Comments:         Anesthesia Quick Evaluation

## 2017-11-05 NOTE — Progress Notes (Signed)
RN noted swelling at IV site, not painful & no abnormalities in skin color. Removed IV for DC, gave pt a hot pack & advised to elevate arm with hot pack applied once home.

## 2017-11-06 ENCOUNTER — Encounter (HOSPITAL_COMMUNITY): Payer: Self-pay | Admitting: Radiology

## 2017-11-06 ENCOUNTER — Encounter

## 2017-11-06 ENCOUNTER — Telehealth: Payer: Self-pay

## 2017-11-06 ENCOUNTER — Ambulatory Visit: Payer: PRIVATE HEALTH INSURANCE | Admitting: Neurology

## 2017-11-06 NOTE — Telephone Encounter (Signed)
Called pt to relay message below.  No answer.  VM box full.  Unable to relay message below.

## 2017-11-06 NOTE — Telephone Encounter (Signed)
-----   Message from Van ClinesKaren M Aquino, MD sent at 11/05/2017  3:30 PM EDT ----- Pls let him know MRI brain looks good, normal, no evidence of tumor, stroke, or bleed. Thanks

## 2017-11-07 NOTE — Anesthesia Postprocedure Evaluation (Signed)
Anesthesia Post Note  Patient: Vincent [name]  Procedure(s) Performed: MRI WITH ANESTHESIAMRI OF BRAIN WITH AND WITHOUT CONTRAST (N/A )     Patient location during evaluation: PACU Anesthesia Type: General Level of consciousness: awake and alert Pain management: pain level controlled Vital Signs Assessment: post-procedure vital signs reviewed and stable Respiratory status: spontaneous breathing, nonlabored ventilation, respiratory function stable and patient connected to nasal cannula oxygen Cardiovascular status: blood pressure returned to baseline and stable Postop Assessment: no apparent nausea or vomiting Anesthetic complications: no    Last Vitals:  Vitals:   11/05/17 0926 11/05/17 0941  BP: 114/79 115/72  Pulse: 95 (!) 121  Resp: 15 13  Temp: 36.7 C   SpO2: 100% 99%    Last Pain:  Vitals:   11/05/17 0926  TempSrc:   PainSc: 0-No pain                 Alexza Norbeck

## 2017-11-12 MED FILL — Ondansetron HCl Inj 4 MG/2ML (2 MG/ML): INTRAMUSCULAR | Qty: 2 | Status: AC

## 2017-11-12 MED FILL — Propofol IV Emul 200 MG/20ML (10 MG/ML): INTRAVENOUS | Qty: 20 | Status: AC

## 2017-11-12 MED FILL — Midazolam HCl Inj 2 MG/2ML (Base Equivalent): INTRAMUSCULAR | Qty: 2 | Status: AC

## 2017-11-12 MED FILL — Lidocaine HCl Local Soln Prefilled Syringe 100 MG/5ML (2%): INTRAMUSCULAR | Qty: 5 | Status: AC

## 2017-11-26 ENCOUNTER — Encounter: Payer: Self-pay | Admitting: Gastroenterology

## 2017-11-27 ENCOUNTER — Ambulatory Visit: Payer: PRIVATE HEALTH INSURANCE | Admitting: Neurology

## 2017-12-03 ENCOUNTER — Ambulatory Visit (INDEPENDENT_AMBULATORY_CARE_PROVIDER_SITE_OTHER): Payer: PRIVATE HEALTH INSURANCE | Admitting: Family Medicine

## 2017-12-03 ENCOUNTER — Encounter: Payer: Self-pay | Admitting: Family Medicine

## 2017-12-03 VITALS — BP 116/77 | HR 90 | Ht 67.0 in | Wt 184.0 lb

## 2017-12-03 DIAGNOSIS — R7989 Other specified abnormal findings of blood chemistry: Secondary | ICD-10-CM

## 2017-12-03 DIAGNOSIS — E785 Hyperlipidemia, unspecified: Secondary | ICD-10-CM

## 2017-12-03 DIAGNOSIS — K7210 Chronic hepatic failure without coma: Secondary | ICD-10-CM | POA: Diagnosis not present

## 2017-12-03 DIAGNOSIS — K7011 Alcoholic hepatitis with ascites: Secondary | ICD-10-CM

## 2017-12-03 DIAGNOSIS — K219 Gastro-esophageal reflux disease without esophagitis: Secondary | ICD-10-CM | POA: Diagnosis not present

## 2017-12-03 DIAGNOSIS — F902 Attention-deficit hyperactivity disorder, combined type: Secondary | ICD-10-CM

## 2017-12-03 DIAGNOSIS — E559 Vitamin D deficiency, unspecified: Secondary | ICD-10-CM

## 2017-12-03 DIAGNOSIS — K7030 Alcoholic cirrhosis of liver without ascites: Secondary | ICD-10-CM

## 2017-12-03 DIAGNOSIS — L299 Pruritus, unspecified: Secondary | ICD-10-CM

## 2017-12-03 DIAGNOSIS — K3184 Gastroparesis: Secondary | ICD-10-CM | POA: Diagnosis not present

## 2017-12-03 DIAGNOSIS — R739 Hyperglycemia, unspecified: Secondary | ICD-10-CM | POA: Insufficient documentation

## 2017-12-03 DIAGNOSIS — L239 Allergic contact dermatitis, unspecified cause: Secondary | ICD-10-CM | POA: Insufficient documentation

## 2017-12-03 DIAGNOSIS — D689 Coagulation defect, unspecified: Secondary | ICD-10-CM

## 2017-12-03 MED ORDER — METHYLPHENIDATE HCL 20 MG PO TABS: ORAL_TABLET | ORAL | 0 refills | Status: DC

## 2017-12-03 MED ORDER — TRIAMCINOLONE ACETONIDE 0.1 % EX OINT: 1.0000 "application " | TOPICAL_OINTMENT | Freq: Two times a day (BID) | CUTANEOUS | 1 refills | Status: DC

## 2017-12-03 MED ORDER — HYDROXYZINE HCL 50 MG PO TABS: 25.0000 mg | ORAL_TABLET | Freq: Four times a day (QID) | ORAL | 0 refills | Status: DC | PRN

## 2017-12-03 NOTE — Progress Notes (Addendum)
Transmission of RX failed to pharmacy.  Please resend.  Tiajuana Amass. Quynh Basso, CMA  I sent three different scripts - one for each month for 3 mo- please advise which one they said failed- was it all three? Did any go through. .   Per the chart it says the one from 9\13-  10\13 has failed transmission.  I will resend just that monthly one

## 2017-12-03 NOTE — Progress Notes (Signed)
ADHD/ ADD OV note   Impression and Recommendations:    1. Attention deficit hyperactivity disorder (ADHD), combined type   2. Gastroesophageal reflux disease, esophagitis presence not specified   3. Gastroparesis with freq burping   4. Chronic liver failure without hepatic coma (HCC)   5. Alcoholic cirrhosis, unspecified whether ascites present (HCC)   6. Coagulopathy (HCC)   7. Hyperlipidemia, unspecified hyperlipidemia type   8. Ascites due to alcoholic hepatitis   9. Elevated TSH   10. Vitamin D deficiency   11. Hyperglycemia   12. Allergic contact dermatitis, unspecified trigger   13. Itch    1. ADHD - Continue Ritalin as prescribed.  Refill provided today. - Patient tolerating medication well without S-E. - Sx controlled on treatment at this time.  - Medication profile and side effects discussed stimulants in general.  - Discussed prudent diet and avoiding carbohydrate loading. Importance of daily exercise stressed.   - Reviewed importance of adequate sleep to manage sx.  2. Contact Dermatitis - Patient with resolving rash. - Prescriptions provided today to alleviate patient sx.  See med list today.  3. Mood - Mood stable at this time. - No changes to treatment plan made today.  - Encouraged patient to seek counseling or life coach.  4. Alcoholic Cirrhosis/Chronic Liver Failure/Alcoholic Hepatitis  - Strongly encouraged patient to continue to avoid alcohol consumption.  5. GERD; Gastroparesis with freq burping  - Continue treatment as prescribed.  See med list below.  6. BMI Counseling Explained to patient what BMI refers to, and what it means medically.    Told patient to think about it as a "medical risk stratification measurement" and how increasing BMI is associated with increasing risk/ or worsening state of various diseases such as hypertension, hyperlipidemia, diabetes, premature OA, depression etc.  American Heart Association guidelines for  healthy diet, basically Mediterranean diet, and exercise guidelines of 30 minutes 5 days per week or more discussed in detail.  Health counseling performed.  All questions answered.  7. Lifestyle & Preventative Health Maintenance - Advised patient to continue working toward exercising to improve overall mental, physical, and emotional health.    - Encouraged patient to engage in daily physical activity, especially a formal exercise routine.  Recommended that the patient eventually strive for at least 150 minutes of moderate cardiovascular activity per week according to guidelines established by the [facility].   - Healthy dietary habits encouraged, including low-carb, and high amounts of lean protein in diet.   - Patient should also consume adequate amounts of water - half of body weight in oz of water per day.   Education and routine counseling performed. Handouts provided if pt desired.  8. Follow-Up - Last labs 09/11/2017. - Lab work ordered today.  - Return in 3 months as recommended for regularly scheduled chronic follow-up.  - Otherwise, return for acute concerns PRN.   Orders Placed This Encounter  Procedures  . VITAMIN D 25 Hydroxy (Vit-D Deficiency, Fractures)  . TSH  . T4, free  . Phosphorus  . Magnesium  . Lipid panel  . Hemoglobin A1c  . Comprehensive metabolic panel  . CBC with Differential/Platelet  . Vitamin B1  . B12 and Folate Panel     Return for 2mo f/up or sooner prn.   -Reminded patient the need for yearly complete physical exam office visits in addition to office visits for management of the chronic diseases  -Gross side effects, risk and benefits, and alternatives of medications  discussed with patient.  Patient is aware that all medications have potential side effects and we are unable to predict every side effect or drug-drug interaction that may occur.  Expresses verbal understanding and consents to current therapy plan and treatment regimen.   Please  see AVS handed out to patient at the end of our visit for further patient instructions/ counseling done pertaining to today's office visit.    Note: This document was prepared using Dragon voice recognition software and may include unintentional dictation errors.  This document serves as a record of services personally performed by [name], DO. It was created on her behalf by Peggye Fothergill, a trained medical scribe. The creation of this record is based on the scribe's personal observations and the provider's statements to them.   I have reviewed the above medical documentation for accuracy and completeness and I concur.  [name] 12/03/17 5:11 PM    ___________________________________________________________________   Subjective:  HPI: Vincent [name] y.o. male  presents for 3 month follow up for multiple medical problems.  Notes that he's feeling well overall.  He's finishing with [facility] at the end of this month.  He's talking with [facility]; notes he has interviews in [place] and [place] coming up.  Contact Dermatitis He's had a rash that has been bothering him.  Notes it's getting better but still "itches like hell."  Feels that it's contact dermatitis but cannot remember what he came into contact with.  Mood & Sobriety He's feeling better.  His mood has been okay.  Overall states he procrastinates enough as it is; the stress does bother him sometimes.  He hasn't had anything to drink at all lately.  Feels it has surprisingly been not that bad to avoid drinking.  He notes he's not as social as he used to be.  States he's more content sitting and watching TV.  He and his wife are doing okay.  States that his wife has appreciated his avoidance of alcohol.  He feels their relationship has improved a bit.  One of his wife's relatives (a doctor) committed suicide in early June.  He is not going to counseling at this time.  ADHD Feels the Ritalin is  working for him and helping him stay more focused.  Confirms that his current dose is working well.  He feels that he's only irritable when he's really tired.  States he usually sleeps okay.  GERD Patient notes that he's been burping more often lately.    He continues on treatment as prescribed.   Problem  Gastroparesis  Hyperlipidemia  Elevated Tsh  Vitamin D Deficiency  Hyperglycemia  Allergic Contact Dermatitis  H/O Partial Seizures    Depression screen [facility] 2/9 12/03/2017 09/10/2017 08/13/2017 08/06/2017  Decreased Interest 1 1 1  0  Down, Depressed, Hopeless 0 1 1 0  PHQ - 2 Score 1 2 2  0  Altered sleeping 2 2 2 1   Tired, decreased energy 2 2 2  -  Change in appetite 0 0 1 0  Feeling bad or failure about yourself  1 1 0 0  Trouble concentrating 2 2 3 2   Moving slowly or fidgety/restless 0 0 0 0  Suicidal thoughts 0 0 0 0  PHQ-9 Score 8 9 10 3   Difficult doing work/chores Somewhat difficult Not difficult at all - Not difficult at all    Weight:  Wt Readings from Last 3 Encounters:  12/03/17 184 lb (83.5 kg)  11/05/17 176 lb (79.8 kg)  11/01/17 176  lb 4 oz (79.9 kg)   BMI Readings from Last 3 Encounters:  12/03/17 28.82 kg/m  11/05/17 27.57 kg/m  11/01/17 27.60 kg/m   BP Readings from Last 3 Encounters:  12/03/17 116/77  11/05/17 115/72  11/01/17 110/84     Review of Systems: General:   No F/C, wt loss Pulm:   No DIB, SOB, pleuritic chest pain Card:  No CP, palpitations Abd:  No n/v/d or pain Ext:  No inc edema from baseline   Objective: Physical Exam: BP 116/77   Pulse 90   Ht 5\' 7"  (1.702 m)   Wt 184 lb (83.5 kg)   SpO2 99%   BMI 28.82 kg/m  Body mass index is 28.82 kg/m. General: Well nourished, in no apparent distress. Eyes: PERRLA, EOMs, conjunctiva clr no swelling or erythema Neck: supple Resp: Respiratory effort- normal, ECTA B/L w/o W/R/R  Cardio: RRR w/o MRGs. Skin: Scattered tiny erythematous papules, upper outer left arm, and few  scattered on back.  Warm, dry without rashes, lesions, ecchymosis.  Neuro: Alert, Oriented Psych: Normal affect, Insight and Judgment appropriate.    Current Medications:  Current Outpatient Medications on File Prior to Visit  Medication Sig Dispense Refill  . folic acid (FOLVITE) 1 MG tablet Take 1 tablet (1 mg total) by mouth daily. 90 tablet 1  . Melatonin 5 MG TABS Take 5 mg by mouth at bedtime as needed (for sleep).    . Multiple Vitamin (MULTIVITAMIN WITH MINERALS) TABS tablet Take 1 tablet by mouth daily. 30 tablet 0  . omeprazole (PRILOSEC) 20 MG capsule Take 1 capsule (20 mg total) by mouth daily. 90 capsule 1  . ondansetron (ZOFRAN-ODT) 8 MG disintegrating tablet Take 1 tablet (8 mg total) by mouth every 8 (eight) hours as needed for nausea. 30 tablet 1  . thiamine 100 MG tablet Take 1 tablet (100 mg total) by mouth daily. 90 tablet 1   Current Facility-Administered Medications on File Prior to Visit  Medication Dose Route Frequency Provider Last Rate Last Dose  . 0.9 %  sodium chloride infusion  500 mL Intravenous Once Armbruster, Willaim RayasSteven P, MD        Medical History:  Patient Active Problem List   Diagnosis Date Noted  . Gastroparesis 12/03/2017  . Hyperlipidemia 12/03/2017  . Elevated TSH 12/03/2017  . Vitamin D deficiency 12/03/2017  . Hyperglycemia 12/03/2017  . Allergic contact dermatitis 12/03/2017  . H/O partial seizures 09/24/2017  . Alcoholic cirrhosis (HCC) 08/20/2017  . Insomnia 08/20/2017  . Gastroesophageal reflux disease 08/20/2017  . Decreased breath sounds at right lung base   . Ascites   . Liver failure without hepatic coma (HCC)   . Hypokalemia   . Anxiety   . Alcoholic hepatitis with ascites   . Coagulopathy (HCC) 08/08/2017  . Hyponatremia 08/07/2017  . ADHD (attention deficit hyperactivity disorder) 08/06/2017    Allergies:  Allergies  Allergen Reactions  . Amoxicillin Rash     Family history-  Reviewed; changed as  appropriate  Social history-  Reviewed; changed as appropriate

## 2017-12-05 LAB — MAGNESIUM: Magnesium: 2 mg/dL (ref 1.6–2.3)

## 2017-12-05 LAB — COMPREHENSIVE METABOLIC PANEL
A/G RATIO: 1.8 (ref 1.2–2.2)
ALBUMIN: 4.5 g/dL (ref 3.5–5.5)
ALK PHOS: 59 IU/L (ref 39–117)
ALT: 20 IU/L (ref 0–44)
AST: 27 IU/L (ref 0–40)
BUN / CREAT RATIO: 17 (ref 9–20)
BUN: 16 mg/dL (ref 6–20)
Bilirubin Total: 0.4 mg/dL (ref 0.0–1.2)
CHLORIDE: 102 mmol/L (ref 96–106)
CO2: 24 mmol/L (ref 20–29)
Calcium: 9.9 mg/dL (ref 8.7–10.2)
Creatinine, Ser: 0.95 mg/dL (ref 0.76–1.27)
GFR calc Af Amer: 120 mL/min/{1.73_m2} (ref >59)
GFR calc non Af Amer: 104 mL/min/{1.73_m2} (ref >59)
GLOBULIN, TOTAL: 2.5 g/dL (ref 1.5–4.5)
Glucose: 92 mg/dL (ref 65–99)
Potassium: 4.5 mmol/L (ref 3.5–5.2)
SODIUM: 141 mmol/L (ref 134–144)
Total Protein: 7 g/dL (ref 6.0–8.5)

## 2017-12-05 LAB — T4, FREE: FREE T4: 0.76 ng/dL — AB (ref 0.82–1.77)

## 2017-12-05 LAB — CBC WITH DIFFERENTIAL/PLATELET
BASOS ABS: 0.1 10*3/uL (ref 0.0–0.2)
Basos: 1 %
EOS (ABSOLUTE): 0.6 10*3/uL — AB (ref 0.0–0.4)
EOS: 8 %
HEMATOCRIT: 41.9 % (ref 37.5–51.0)
Hemoglobin: 13.9 g/dL (ref 13.0–17.7)
Immature Grans (Abs): 0 10*3/uL (ref 0.0–0.1)
Immature Granulocytes: 0 %
LYMPHS ABS: 2.4 10*3/uL (ref 0.7–3.1)
Lymphs: 33 %
MCH: 30 pg (ref 26.6–33.0)
MCHC: 33.2 g/dL (ref 31.5–35.7)
MCV: 91 fL (ref 79–97)
MONOS ABS: 0.6 10*3/uL (ref 0.1–0.9)
Monocytes: 8 %
NEUTROS PCT: 50 %
Neutrophils Absolute: 3.7 10*3/uL (ref 1.4–7.0)
Platelets: 212 10*3/uL (ref 150–450)
RBC: 4.63 x10E6/uL (ref 4.14–5.80)
RDW: 14.7 % (ref 12.3–15.4)
WBC: 7.2 10*3/uL (ref 3.4–10.8)

## 2017-12-05 LAB — LIPID PANEL
CHOL/HDL RATIO: 2.4 ratio (ref 0.0–5.0)
Cholesterol, Total: 194 mg/dL (ref 100–199)
HDL: 81 mg/dL (ref >39)
LDL Calculated: 93 mg/dL (ref 0–99)
TRIGLYCERIDES: 98 mg/dL (ref 0–149)
VLDL Cholesterol Cal: 20 mg/dL (ref 5–40)

## 2017-12-05 LAB — TSH: TSH: 2.99 u[IU]/mL (ref 0.450–4.500)

## 2017-12-05 LAB — B12 AND FOLATE PANEL
FOLATE: 15.8 ng/mL (ref >3.0)
Vitamin B-12: 350 pg/mL (ref 232–1245)

## 2017-12-05 LAB — HEMOGLOBIN A1C
Est. average glucose Bld gHb Est-mCnc: 88 mg/dL
HEMOGLOBIN A1C: 4.7 % — AB (ref 4.8–5.6)

## 2017-12-05 LAB — VITAMIN B1: THIAMINE: 180.9 nmol/L (ref 66.5–200.0)

## 2017-12-05 LAB — PHOSPHORUS: PHOSPHORUS: 5.1 mg/dL — AB (ref 2.5–4.5)

## 2017-12-05 LAB — VITAMIN D 25 HYDROXY (VIT D DEFICIENCY, FRACTURES): VIT D 25 HYDROXY: 20.1 ng/mL — AB (ref 30.0–100.0)

## 2017-12-24 ENCOUNTER — Ambulatory Visit: Payer: PRIVATE HEALTH INSURANCE | Admitting: Neurology

## 2018-01-26 ENCOUNTER — Other Ambulatory Visit: Payer: Self-pay | Admitting: Family Medicine

## 2018-01-26 DIAGNOSIS — K219 Gastro-esophageal reflux disease without esophagitis: Secondary | ICD-10-CM

## 2018-02-23 ENCOUNTER — Other Ambulatory Visit: Payer: Self-pay | Admitting: Family Medicine

## 2018-02-24 MED ORDER — METHYLPHENIDATE HCL 20 MG PO TABS: ORAL_TABLET | ORAL | 0 refills | Status: DC

## 2018-03-04 ENCOUNTER — Ambulatory Visit: Payer: BLUE CROSS/BLUE SHIELD | Admitting: Family Medicine

## 2018-03-04 ENCOUNTER — Encounter: Payer: Self-pay | Admitting: Family Medicine

## 2018-03-04 VITALS — BP 124/84 | HR 94 | Temp 97.7°F | Ht 67.0 in | Wt 190.0 lb

## 2018-03-04 DIAGNOSIS — K3184 Gastroparesis: Secondary | ICD-10-CM | POA: Diagnosis not present

## 2018-03-04 DIAGNOSIS — Z23 Encounter for immunization: Secondary | ICD-10-CM

## 2018-03-04 DIAGNOSIS — F902 Attention-deficit hyperactivity disorder, combined type: Secondary | ICD-10-CM

## 2018-03-04 DIAGNOSIS — Z111 Encounter for screening for respiratory tuberculosis: Secondary | ICD-10-CM | POA: Diagnosis not present

## 2018-03-04 DIAGNOSIS — F5101 Primary insomnia: Secondary | ICD-10-CM

## 2018-03-04 DIAGNOSIS — K7030 Alcoholic cirrhosis of liver without ascites: Secondary | ICD-10-CM

## 2018-03-04 MED ORDER — METHYLPHENIDATE HCL 20 MG PO TABS: 30.0000 mg | ORAL_TABLET | Freq: Two times a day (BID) | ORAL | 0 refills | Status: DC

## 2018-03-04 MED ORDER — HYDROXYZINE HCL 50 MG PO TABS: 25.0000 mg | ORAL_TABLET | Freq: Every evening | ORAL | 0 refills | Status: DC | PRN

## 2018-03-04 NOTE — Progress Notes (Signed)
ADHD/ ADD OV note   Impression and Recommendations:    1. Attention deficit hyperactivity disorder (ADHD), combined type   2. Gastroparesis with freq burping   3. Alcoholic cirrhosis, unspecified whether ascites present (HCC)   4. Primary insomnia   5. Flu vaccine need   6. Screening-pulmonary TB     1. ADHD & Mood - Stable at this time. - Increase dose to 30 BID.  See med list below. - Patient tolerating meds well without complication.  Denies S-E.  - Patient is moving to a new area and starting new job in seven weeks.  Per patient, wanted to increase his dose to ensure he is more focused while starting his new job.  - Discussed importance of prudent diet, including fish oil and avoiding carbohydrate loading.  Importance of daily exercise stressed.  - Discussed importance of continuing to manage his mood with adequate sleep, prudent diet, regular exercise, meditation, prayer, counseling as needed.  2. Gastroparesis w/ Freq burping - Stable at this time. - Continue treatment plan as prescribed.  See med list today. - Patient tolerating meds well.  Denies S-E.  3. Alcoholic Cirrhosis - Stable at this time. - Continue treatment plan as prescribed and recommended. - Patient knows to follow up with specialists as scheduled and indicated.  4. Neurology Follow-Up - Patient compliant with six months of not driving, per neurology.  - Patient understands that he needs to follow up with neurology and all other specialists as scheduled and recommended.  5. Insomnia - Prescription provided today for use PRN.  See med list today. - Emphasized critical importance of managing sleep without assistance of medication, through prudent habits such as healthy diet and regular exercise.  6. BMI Counseling Explained to patient what BMI refers to, and what it means medically.    Told patient to think about it as a "medical risk stratification measurement" and how increasing BMI is  associated with increasing risk/ or worsening state of various diseases such as hypertension, hyperlipidemia, diabetes, premature OA, depression etc.  American Heart Association guidelines for healthy diet, basically Mediterranean diet, and exercise guidelines of 30 minutes 5 days per week or more discussed in detail.  Health counseling performed.  All questions answered.  7. Lifestyle & Preventative Health Maintenance - Advised patient to continue working toward exercising to improve overall mental, physical, and emotional health.    - Encouraged patient to engage in daily physical activity, especially a formal exercise routine.  Recommended that the patient eventually strive for at least 150 minutes of moderate cardiovascular activity per week according to guidelines established by the [facility].   - Healthy dietary habits encouraged, including low-carb, and high amounts of lean protein in diet.   - Patient should also consume adequate amounts of water.   Education and routine counseling performed. Handouts provided if pt desired.  8. Follow-Up - Prescriptions provided and refilled today PRN. - Discussed need for lab work in the near future. - Advised patient to follow up with all specialists as recommended. - Otherwise, continue to return for CPE and chronic follow-up as scheduled.   - Patient knows to call in sooner if desired to address acute concerns.   Orders Placed This Encounter  Procedures  . Flu Vaccine QUAD 6+ mos PF IM (Fluarix Quad PF)  . QuantiFERON-TB Gold Plus    Medications Discontinued During This Encounter  Medication Reason  . folic acid (FOLVITE) 1 MG tablet Patient Preference  . hydrOXYzine (  ATARAX/VISTARIL) 50 MG tablet   . methylphenidate (RITALIN) 20 MG tablet   . methylphenidate (RITALIN) 20 MG tablet   . thiamine 100 MG tablet Patient Preference  . triamcinolone ointment (KENALOG) 0.1 %      Orders Placed This Encounter  Procedures  . Flu Vaccine  QUAD 6+ mos PF IM (Fluarix Quad PF)  . QuantiFERON-TB Gold Plus      Return for Follow-up 6 months or sooner, increased med from 20 to 30 twice daily.   -Reminded patient the need for yearly complete physical exam office visits in addition to office visits for management of the chronic diseases  -Gross side effects, risk and benefits, and alternatives of medications discussed with patient.  Patient is aware that all medications have potential side effects and we are unable to predict every side effect or drug-drug interaction that may occur.  Expresses verbal understanding and consents to current therapy plan and treatment regimen.   Please see AVS handed out to patient at the end of our visit for further patient instructions/ counseling done pertaining to today's office visit.    Note:  This document was prepared using Dragon voice recognition software and may include unintentional dictation errors.   This document serves as a record of services personally performed by [name], DO. It was created on her behalf by Peggye Fothergill, a trained medical scribe. The creation of this record is based on the scribe's personal observations and the provider's statements to them.   I have reviewed the above medical documentation for accuracy and completeness and I concur.  [name], DO, D.O. 03/04/2018 3:59 PM       ________________________________________________________________________    Subjective:  HPI: Vincent [name] y.o. male  presents for 3 month follow up for evaluation of our treatment plan for pt's Mood and ADD/ADHD.  Patient says he is doing well.  He and his wife will be moving to [place] soon.  Currently trying to get his house ready to sell for the move.  Notes that he was kind of "enjoying doing nothing" and is "in denial about it."  Big changes are still about seven weeks away.  They are going to be living in [place], renting a house.   Notes he will be diving straight into his new job full time.   Mood Notes that he's been feeling well.  "I guess it's not working for several weeks."  Hasn't had a drink at all.  Notes no desire to drink.  States "I think I just felt like such shit," that's why he hasn't touched another drink.  ADD Management Notes they've been working, but sometimes he has to take "another half or so."  Patient feels his meds start working on his current dose, but notes "it doesn't seem like it's lasting."  Patient is sleeping okay, notes "for the most part."  Uses melatonin "yes and no."  Lifestyle Habits He and his wife are doing well.  Admits that it was a difficult time to go through personally and as a couple, but they are working through it.  Patient has gained a few pounds.  Feels that it's been easy to gain since he's sitting around a fridge full of food.  Admits that he may be turning to food for more comfort than he needs to, eating a lot of "ice pops/freezer pops."  States that sometimes a cheeseburger sounds better than it needs to.  Denies nausea, food intolerances, denies upper GI complaints, patient has been urinating  fine, peeing and pooping fine.  Patient notes he had an EGD but never followed up with GI.  Notes he was scheduled for follow-up with neurology in September, but due to scheduling difficulties, he had to cancel his appointment.  He has not been able to follow up with neurology.  Patient denies incident of seizures.  Patient is still compliant with six months of not driving, per neurology.   No problems updated.   Wt Readings from Last 3 Encounters:  03/04/18 190 lb (86.2 kg)  12/03/17 184 lb (83.5 kg)  11/05/17 176 lb (79.8 kg)    BMI Readings from Last 3 Encounters:  03/04/18 29.76 kg/m  12/03/17 28.82 kg/m  11/05/17 27.57 kg/m    BP Readings from Last 3 Encounters:  03/04/18 124/84  12/03/17 116/77  11/05/17 115/72     Review of Systems: General:    No F/C, wt loss Pulm:   No DIB, SOB, pleuritic chest pain Card:  No CP, palpitations Abd:  No n/v/d or pain Ext:  No inc edema from baseline   Objective: Physical Exam: BP 124/84   Pulse 94   Temp 97.7 F (36.5 C)   Ht 5\' 7"  (1.702 m)   Wt 190 lb (86.2 kg)   SpO2 99%   BMI 29.76 kg/m  Body mass index is 29.76 kg/m. General: Well nourished, in no apparent distress. Eyes: PERRLA, EOMs, conjunctiva clr no swelling or erythema Neck: supple Resp: Respiratory effort- normal, ECTA B/L w/o W/R/R  Cardio: RRR w/o MRGs. Skin: Warm, dry without rashes, lesions, ecchymosis.  Neuro: Alert, Oriented Psych: Normal affect, Insight and Judgment appropriate.    Current Medications:  Current Outpatient Medications on File Prior to Visit  Medication Sig Dispense Refill  . Melatonin 5 MG TABS Take 5 mg by mouth at bedtime as needed (for sleep).    . methylphenidate (RITALIN) 20 MG tablet One tab in the am and one in afternoon/evening as needed. 60 tablet 0  . Multiple Vitamin (MULTIVITAMIN WITH MINERALS) TABS tablet Take 1 tablet by mouth daily. 30 tablet 0  . omeprazole (PRILOSEC) 20 MG capsule Take 1 capsule (20 mg total) by mouth daily. 90 capsule 1  . ondansetron (ZOFRAN-ODT) 8 MG disintegrating tablet Take 1 tablet (8 mg total) by mouth every 8 (eight) hours as needed for nausea. 30 tablet 1   Current Facility-Administered Medications on File Prior to Visit  Medication Dose Route Frequency Provider Last Rate Last Dose  . 0.9 %  sodium chloride infusion  500 mL Intravenous Once Armbruster, Willaim RayasSteven P, MD        Medical History:  Patient Active Problem List   Diagnosis Date Noted  . Gastroparesis 12/03/2017  . Hyperlipidemia 12/03/2017  . Elevated TSH 12/03/2017  . Vitamin D deficiency 12/03/2017  . Hyperglycemia 12/03/2017  . Allergic contact dermatitis 12/03/2017  . H/O partial seizures 09/24/2017  . Alcoholic cirrhosis (HCC) 08/20/2017  . Insomnia 08/20/2017  . Gastroesophageal  reflux disease 08/20/2017  . Decreased breath sounds at right lung base   . Ascites   . Liver failure without hepatic coma (HCC)   . Hypokalemia   . Anxiety   . Alcoholic hepatitis with ascites   . Coagulopathy (HCC) 08/08/2017  . Hyponatremia 08/07/2017  . ADHD (attention deficit hyperactivity disorder) 08/06/2017    Allergies:  Allergies  Allergen Reactions  . Amoxicillin Rash     Family history-  Reviewed; changed as appropriate  Social history-  Reviewed; changed as appropriate

## 2018-03-04 NOTE — Patient Instructions (Signed)
Since you are moving to the [place] area I will give you 6 months of medicines until you get established down there.  In the future if you would like to continue here we can see you every 6 months or if needed every 4 months.  We will play it by ear.   Again, if you are having any problems or concerns about the adjusted dose, please make sure you reach out to us via MyChart or phone call  Goal is to try to work out 30 minutes 5 days a week or more.  This is very important for stress management, as well as to better control your ADHD symptoms as you are embarking on a new practice, in a new town etc.    If you have insomnia or difficulty sleeping, this information is for you:  - Avoid caffeinated beverages after lunch,  no alcoholic beverages,  no eating within 2-3 hours of lying down,  avoid exposure to blue light before bed,  avoid daytime naps, and  needs to maintain a regular sleep schedule- go to sleep and wake up around the same time every night.   - Resolve concerns or worries before entering bedroom:  Discussed relaxation techniques with patient and to keep a journal to write down fears\ worries.  I suggested seeing a counselor for CBT.   - Recommend patient meditate or do deep breathing exercises to help relax.   Incorporate the use of white noise machines or listen to "sleep meditation music", or recordings of guided meditations for sleep from YouTube which are free, such as  "guided meditation for detachment from over thinking"  by Ina KickMichael Sealey.         What is Chronic Stress Syndrome, Symptoms & Ways to Deal With it   What is Chronic Stress Syndrome?  Chronic Stress Syndrome is something which can now be called as a medical condition due to the amount of stress an individual is going through these days. Chronic Stress Syndrome causes the body and mind to shutdown and the person has no control over himself or herself. Due to the demands of modern day life and the hardship  throughout day and night takes its toll over a period of time and the body and brain starts demanding rest and a break. This leads to certain symptoms where your performance level starts to dip at work, you become irritable both at work and at home, you may stop enjoying activities you previously liked, you may become depressed, you may get angry for even small things. Chronic Stress Syndrome can significantly impact your quality life. Thus it is important understand the symptoms of Chronic Stress Syndrome and react accordingly in order to cope up with it.  It is important to note here that a balanced work-home equation should be drawn to cut down symptoms of Chronic Stress Syndrome. Minor stressors can be overcome by the body's inbuilt stress response but when there is unending stress for a long period of time then an external help is required to ease the stress.  Chronic Stress Syndrome can physically and psychologically drain you over a period of time. For such cases stress management is the best way to cope up with Chronic Stress Syndrome. If Chronic Stress Syndrome is not treated then it may result in many health hazards like anxiety, muscle pain, insomnia, and high blood pressure along with a compromised immune system leading to frequent infections and missed days from work.    What are the Symptoms  of Chronic Stress Syndrome?   The symptoms of Chronic Stress Syndrome are variable and range from generalized symptoms to emotional symptoms along with behavioral and cognitive symptoms. Some of these symptoms have been delineated below:  Generalized Symptoms of Chronic Stress Syndrome are: Anxiety Depression Social isolation Headache Abdominal pain Lack of sleep Back pain Difficulty in concentrating Hypertension Hemorrhoids Varicose veins Panic attacks/ Panic disorder Cardiovascular diseases.   Some of the Emotional Symptoms of Chronic Stress Syndrome are: To become easily agitated,  moody and frustrated Feeling overwhelmed which makes you feel like you are losing control. Having difficulty relaxing and have a peaceful mind Having low self esteem Feeling lonely Feeling worthless Feeling depressed Avoiding social environment.   Some of the Physical Symptoms of Chronic Stress Syndrome are: Headaches Lethargy Alternating diarrhea and constipation Nausea Muscles aches and pains Insomnia Rapid heartbeat and chest pain Infections and frequent colds Decreased libido Nervousness and shaking Tinnitus Sweaty palms Dry mouth Clenched jaw.  Some of the Cognitive Symptoms of Chronic Stress Syndrome are: Constant worrying Racing thoughts Disorganization and forgetfulness Inability to focus Poor judgment Abundance of negativity.  Some of the Behavioral Symptoms of Chronic Stress Syndrome are: Changes in appetite with less desire to eat Avoiding responsibilities Indulgence in alcohol or recreational drug use Increased nail biting and being fidgety Ways to Deal With Chronic Stress Syndrome    Chronic Stress Syndrome is not something which cannot be addressed. A bit of effort from your side in the form of lifestyle modifications, a little bit of exercise, a balanced work life equation can do wonders and help you get rid of Chronic Stress Syndrome.  Get Proper Sleep: It has been proved that Chronic Stress Syndrome causes loss of sleep where an individual may not even be able to sleep for days unending. This may result in the individual feeling lethargic and unable to focus at work the following morning. This may lead to decreased performance at work. Thus, it is important to have a good sleep-wake cycle. For this, try and not drink any caffeinated beverage about four hours prior to going to sleep, as caffeine pumps up the adrenaline and causes you to stay awake resulting ultimately in Chronic Stress Syndrome.  Avoid Alcohol and Drugs: Another way to get rid of Chronic  Stress Syndrome is lifestyle modifications. Stay away from alcohol and other recreational drugs. Take Short Frequent Breaks at Work: Try to take frequent breaks from work and do not work continuously. Try and manage your work in such a way that you even meet your deadline and come home on time for a happy dinner with family. A good time spent with family and kids does wonders in not only dealing with Chronic Stress Syndrome but also preventing it.  Become Physically Active: Another step towards getting rid of Chronic Stress Syndrome is physical activity. If you do not have time to spend at the gym then at least try and go for daily walks for about half an hour a day which not only keeps the stress away but also is good for your overall health. Physical activity leads to production of endorphins which will make you feel relaxed and feel good.  Healthy Diet Can Help You Deal With Chronic Stress Syndrome: Have a balanced and healthy diet is another step towards a stress free life and keeping Chronic Stress Syndrome at bay. If time is a constraint then you can try eating three small meals a day. Try and avoid fast foods and take  foods which are healthy and rich in proteins, fiber, and carbohydrates to boost your energy system.  Music Can Soothe Your Mind: Light music is one of the best and most effective relaxation techniques that one can try to overcome stress. It has shown to calm down the mind and take you away from all the stressors that you may be having. These days it is also being used as a therapy in some institutes for overcoming stress. It is important here to discuss the importance of a good social support system for patients with Chronic Stress Syndrome, as a good social support framework can do wonders in taking the stress away from the patient and overcoming Chronic Stress Syndrome.  Meditation Can Help You Deal With Chronic Stress Syndrome Effectively: Meditation and yoga has also shown to be  quite effective in relaxing the mind and coping up with Chronic Stress Syndrome   In cases where these measures are not helpful, then it is time for you to consult with a skilled psychologist or a psychiatrist for potential therapies or medications to control the stress response.   The psychologist can help you with a variety of steps for coping up with Chronic Stress Syndrome. Relaxation techniques and behavioral therapy are some of the methods employed by psychologists. In some cases, medications can also be given to help relax the patient.  Since Chronic Stress Syndrome is both emotionally and physically draining for the patient and it also adversely affects the family life of the patient hence it is important for the patient to recognize the condition and taking steps to cope up with it. Escaping measures like alcohol and drug use are of no help as they only aggravate the condition apart from their other health hazards. If this condition is ignored or left untreated it can lead to various medical conditions like anxiety and depression and various other medical conditions.  Last but not least, smile as often as you can as it is the best gift that you can give to someone. The best way to stay relaxed is to have a good smile, exercise daily, spend time with your family, meditation and if required consultation with a good psychologist so that you can live a stress free life and overcome the symptoms of Chronic Stress Syndrome.

## 2018-03-08 LAB — QUANTIFERON-TB GOLD PLUS
QUANTIFERON TB1 AG VALUE: 0.02 [IU]/mL
QUANTIFERON-TB GOLD PLUS: NEGATIVE
QuantiFERON Mitogen Value: >10 IU/mL
QuantiFERON Nil Value: 0.02 IU/mL
QuantiFERON TB2 Ag Value: 0.03 IU/mL

## 2018-03-14 ENCOUNTER — Telehealth: Payer: Self-pay | Admitting: Family Medicine

## 2018-03-14 DIAGNOSIS — F902 Attention-deficit hyperactivity disorder, combined type: Secondary | ICD-10-CM

## 2018-03-14 NOTE — Telephone Encounter (Signed)
Patient called states his dosage of :  ( Increase but  Rx supply will get him thru to 03/23/18) methylphenidate (RITALIN) 20 MG tablet [[id number]][[id number]]   Order Details  Dose: 30 mg Route: Oral Frequency: 2 times daily with breakfast and lunch  Dispense Quantity: 270 tablet Refills: 0 Fills remaining: --        Sig: Take 1.5 tablets (30 mg total) by mouth 2 (two) times daily with breakfast and lunch.     --Pt requests a call back @ [phone] or add't  Rx sent to pharmacy:   [id number] - [place], [place] - [address] AT [facility] OF [address] & [address] [phone] (Phone) [phone] (Fax)    ----Forwarding request to medical assistant---  --Fausto Skillernglh

## 2018-03-14 NOTE — Telephone Encounter (Signed)
Spoke to the patient, he states that because of the increase dose he will be out of medication before the new RX fill date of 03/23/2018.   Patient seen 03/04/2018. Please review and advise.

## 2018-03-17 ENCOUNTER — Other Ambulatory Visit: Payer: Self-pay

## 2018-03-17 DIAGNOSIS — F902 Attention-deficit hyperactivity disorder, combined type: Secondary | ICD-10-CM

## 2018-03-17 MED ORDER — METHYLPHENIDATE HCL 20 MG PO TABS: 30.0000 mg | ORAL_TABLET | Freq: Two times a day (BID) | ORAL | 0 refills | Status: DC

## 2018-03-17 NOTE — Telephone Encounter (Signed)
Due to a change in medication dose on the ritalin patient will be out before next RX starts.  Per Dr. Sharee Holsterpalski reprinted prescriptions to refect the change in dose and correct fill date to today.  RXs were entered to reflex this change in dose and correct date and sent to Dr. Sharee Holsterpalski. MPulliam, CMA/RT(R)

## 2018-03-17 NOTE — Telephone Encounter (Signed)
If that is in fact the case, ok to RF

## 2018-05-27 ENCOUNTER — Encounter: Payer: Self-pay | Admitting: Family Medicine

## 2018-06-04 ENCOUNTER — Other Ambulatory Visit: Payer: Self-pay

## 2018-06-04 DIAGNOSIS — F902 Attention-deficit hyperactivity disorder, combined type: Secondary | ICD-10-CM

## 2018-06-04 NOTE — Telephone Encounter (Signed)
Reviewed chart patient was last seen on 03/04/2018, medication increased and new RX was sent in for 03/18/2019 and 06/17/2018 for 90 day supply to local pharmacy. Patient states that he is doing well, with no issues. Patient states that medication is not effecting sleep. The [facility] that is [facility] to be [facility] and due to patient moving he needs to change pharmacy and is requesting a new RX to be sent to [facility] in [place]. Due to this being a controled substance pharmacy can not transfer and [facility]. Reviewed the [place] controled substance data base and no aberrancies noted. Please advise. MPulliam, CMA/RT(R)    Per note sent back from Dr. Sharee Holster medication refill approved for 90 day supply. MPulliam, CMA/RT(R)

## 2018-06-05 NOTE — Telephone Encounter (Signed)
-->   Please not send this medicine out until you speak with patient and document his response as we have plenty of time before 2/25 rolls around.     Please call the patient and inform him that like many medicines, this medicine can also lower his seizure threshold, and since he is already had a seizure, he needs to understand the risks associated with this medicine.     Please let him know my recommendation would be for him to take the lowest possible dose that would still be effective in controlling his attention deficits.     Please see if he is willing to decrease the dose and also please document if he is willing to accept above risks.  Also, please let the patient know that due to his complicated medical history, although I have thoroughly enjoyed taking care of him, I honestly think it would be best for him to establish with a primary care provider down in [place] (near his home) who can see him more often and keep a closer eye on him then I will be able to do from here.   This is honestly what is best for him.  Please let me know patient's thoughts about this.

## 2018-06-10 NOTE — Telephone Encounter (Signed)
Called the patient unable to reach him.  Sent him a Clinical cytogeneticist message as I know that he checks his message through it in a timely manner. MPulliam, CMA/RT(R)

## 2018-06-11 ENCOUNTER — Other Ambulatory Visit: Payer: Self-pay | Admitting: Family Medicine

## 2018-06-11 DIAGNOSIS — F902 Attention-deficit hyperactivity disorder, combined type: Secondary | ICD-10-CM

## 2018-06-12 NOTE — Telephone Encounter (Signed)
Message sent to patient per Dr. Sharee Holster waiting on response.  Please see other note. MPulliam, CMA/RT(R)

## 2018-06-17 ENCOUNTER — Other Ambulatory Visit: Payer: Self-pay | Admitting: Adult Health

## 2018-06-17 ENCOUNTER — Telehealth: Payer: Self-pay | Admitting: Family Medicine

## 2018-06-17 DIAGNOSIS — F902 Attention-deficit hyperactivity disorder, combined type: Secondary | ICD-10-CM

## 2018-06-17 MED ORDER — METHYLPHENIDATE HCL 20 MG PO TABS: 30.0000 mg | ORAL_TABLET | Freq: Two times a day (BID) | ORAL | 0 refills | Status: DC

## 2018-06-17 NOTE — Telephone Encounter (Signed)
Patient called states he has moved & needs Rx send to [facility] [place], [place] [facility] but was told it was a controlled substance & Provider would have to change pharmacy information.    Outpatient Medication Detail    Disp Refills Start End   methylphenidate (RITALIN) 20 MG tablet 270 tablet 0 06/17/2018 09/15/2018   Sig - Route: Take 1.5 tablets (30 mg total) by mouth 2 (two) times daily. - Oral   Sent to pharmacy as: methylphenidate (RITALIN) 20 MG tablet   Earliest Fill Date: 06/17/2018   E-Prescribing Status: Receipt confirmed by pharmacy (03/17/2018 12:58 PM EST)    ----[place] message to send Rx refill orders to :   [facility][id number] - [place], [place] - [address] AT [facility] OF [address] [phone] (Phone) [phone] (Fax)   --glh

## 2018-06-17 NOTE — Telephone Encounter (Signed)
Dr. Sharee Holster patient.  Patient is currently out of medication and has requested medication earlier in the month but Dr. Sharee Holster needed some questions answered first which are all in the chart.  Dr. Sharee Holster has approved the refill and it is the [facility]  Patient has moved and needed a pharmacy change.  [facility] and [facility] and checked the [place] database and noted no aberrancies. Will you please review and see if this is something you can send in or if it needs to wait for Dr. Sharee Holster to return to the office. Thank you. MPulliam, CMA/RT(R)

## 2018-07-29 ENCOUNTER — Encounter: Payer: Self-pay | Admitting: Neurology

## 2018-08-05 ENCOUNTER — Ambulatory Visit: Payer: PRIVATE HEALTH INSURANCE | Admitting: Neurology

## 2018-08-05 ENCOUNTER — Encounter

## 2018-08-12 ENCOUNTER — Other Ambulatory Visit: Payer: Self-pay

## 2018-08-12 DIAGNOSIS — K219 Gastro-esophageal reflux disease without esophagitis: Secondary | ICD-10-CM

## 2018-08-12 MED ORDER — OMEPRAZOLE 20 MG PO CPDR: 20.0000 mg | DELAYED_RELEASE_CAPSULE | Freq: Every day | ORAL | 1 refills | Status: DC

## 2018-09-02 ENCOUNTER — Other Ambulatory Visit: Payer: Self-pay

## 2018-09-02 ENCOUNTER — Ambulatory Visit (INDEPENDENT_AMBULATORY_CARE_PROVIDER_SITE_OTHER): Payer: BLUE CROSS/BLUE SHIELD | Admitting: Family Medicine

## 2018-09-02 ENCOUNTER — Encounter: Payer: Self-pay | Admitting: Family Medicine

## 2018-09-02 DIAGNOSIS — F902 Attention-deficit hyperactivity disorder, combined type: Secondary | ICD-10-CM | POA: Diagnosis not present

## 2018-09-02 DIAGNOSIS — K7030 Alcoholic cirrhosis of liver without ascites: Secondary | ICD-10-CM | POA: Diagnosis not present

## 2018-09-02 DIAGNOSIS — R112 Nausea with vomiting, unspecified: Secondary | ICD-10-CM

## 2018-09-02 DIAGNOSIS — F5101 Primary insomnia: Secondary | ICD-10-CM

## 2018-09-02 MED ORDER — HYDROXYZINE HCL 50 MG PO TABS: 25.0000 mg | ORAL_TABLET | Freq: Every evening | ORAL | 0 refills | Status: DC | PRN

## 2018-09-02 MED ORDER — ONDANSETRON 8 MG PO TBDP: 8.0000 mg | ORAL_TABLET | Freq: Three times a day (TID) | ORAL | 0 refills | Status: DC | PRN

## 2018-09-02 MED ORDER — METHYLPHENIDATE HCL 20 MG PO TABS: 30.0000 mg | ORAL_TABLET | Freq: Two times a day (BID) | ORAL | 0 refills | Status: DC

## 2018-09-02 MED ORDER — BUSPIRONE HCL 10 MG PO TABS: ORAL_TABLET | ORAL | 0 refills | Status: DC

## 2018-09-02 NOTE — Progress Notes (Signed)
Telehealth office visit note for Vincent [name], D.O- at Primary Care at [facility]   I connected with current patient today and verified that I am speaking with the correct person using two identifiers.   . Location of the patient: Home . Location of the provider: Office Only the patient (+/- their family members at pt's discretion) and myself were participating in the encounter    - This visit type was conducted due to national recommendations for restrictions regarding the COVID-19 Pandemic (e.g. social distancing) in an effort to limit this patient's exposure and mitigate transmission in our community.  This format is felt to be most appropriate for this patient at this time.   - The patient did not have access to video technology or had technical difficulties with video requiring transitioning to audio format only. - No physical exam could be performed with this format, beyond that communicated to [place] by the patient/ family members as noted.   - Additionally my office staff/ schedulers discussed with the patient that there may be a monetary charge related to this service, depending on their medical insurance.   The patient expressed understanding, and agreed to proceed.       History of Present Illness:  New job in [employer] - going well.   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   Gets along well with folks.    Focus/  ADHD sx:   Doing well on 1.5 tabs BID.  Has no complaints or issues.  No hypertension, no sleep concerns etc.  Feels anxious mood- constant.  Corona virus worries, worries about parents, worries about his new job, worries about his relationship with his wife.  He also just feels on edge and easily irritated by others.  Sometimes it is hard to quiet his brain to sleep at night.  He has not had any more seizures.  This happened in the past with him taking Buproprion only at 300 mg-the first day he doubled it from 150, he had a seizure about an  hour later.  Doubtful it was the reason but it could be.  Also for alcohol abuse: Patient has been good.  He fell off the wagon only 1 time at a friend's wedding in [place] but has not drank since.  His wife and he are getting along well.  However, he finds it difficult as when he has had a hard day and he feels a little stressed, he would really like to have something to help him soothe himself better-rather than [place] in for a drink  -Patient is not exercising.  He used to be an avid exerciser but has not lately over the past several years.   Of note patient requests refill for ondansetron and Vistaril.  He uses the Vistaril seldomly after he has been on call and he finds it difficult to sleep until he gets back into his routine.  Also, he would like to just have the Zofran on hand in case he needs it in the future.     Impression and Recommendations:    1. Attention deficit hyperactivity disorder (ADHD), combined type   2. Non-intractable vomiting with nausea, unspecified vomiting type   3. Primary insomnia   4. Alcoholic cirrhosis, unspecified whether ascites present (HCC)      - As part of my medical decision making, I reviewed the following data within the electronic MEDICAL RECORD NUMBER History obtained from pt /family, CMA notes reviewed and  incorporated if applicable, Labs reviewed, Radiograph/ tests reviewed if applicable and OV notes from prior OV's with me, as well as other specialists she/he has seen since seeing me last, were all reviewed and used in my medical decision making process today.   - Additionally, discussion had with patient regarding txmnt plan, and their biases/concerns about that plan were used in my medical decision making today.   - The patient agreed with the plan and demonstrated an understanding of the instructions.   No barriers to understanding were identified.   - Red flag symptoms and signs discussed in detail.  Patient expressed understanding regarding  what to do in case of emergency\ urgent symptoms.  The patient was advised to call back or seek an in-person evaluation if the symptoms worsen or if the condition fails to improve as anticipated.   Return for pt was told to f/up q 3 mo for txmnt add/ adhd. He prefers to do video/telehealth visits in future.    No orders of the defined types were placed in this encounter.   Meds ordered this encounter  Medications  . methylphenidate (RITALIN) 20 MG tablet    Sig: Take 1.5 tablets (30 mg total) by mouth 2 (two) times daily.    Dispense:  270 tablet    Refill:  0  . ondansetron (ZOFRAN-ODT) 8 MG disintegrating tablet    Sig: Take 1 tablet (8 mg total) by mouth every 8 (eight) hours as needed for nausea.    Dispense:  30 tablet    Refill:  0  . DISCONTD: hydrOXYzine (ATARAX/VISTARIL) 50 MG tablet    Sig: Take 0.5-1 tablets (25-50 mg total) by mouth at bedtime as needed (sleep). For itching.    Dispense:  30 tablet    Refill:  0  . busPIRone (BUSPAR) 10 MG tablet    Sig: Take 0.5 tablets (5 mg total) by mouth 3 (three) times daily for 10 days, THEN 1 tablet (10 mg total) 3 (three) times daily.    Dispense:  270 tablet    Refill:  0    Needs appt for RF  . hydrOXYzine (ATARAX/VISTARIL) 50 MG tablet    Sig: Take 0.5-1 tablets (25-50 mg total) by mouth at bedtime as needed (sleep). For itching.    Dispense:  30 tablet    Refill:  0    Medications Discontinued During This Encounter  Medication Reason  . methylphenidate (RITALIN) 20 MG tablet Reorder  . ondansetron (ZOFRAN-ODT) 8 MG disintegrating tablet Reorder  . hydrOXYzine (ATARAX/VISTARIL) 50 MG tablet Reorder  . hydrOXYzine (ATARAX/VISTARIL) 50 MG tablet      I provided *14 minutes of non-face-to-face time during this encounter,with over 50% of the time in direct counseling on patients medical conditions/ medical concerns.  Additional time was spent with charting and coordination of care after the actual visit commenced.    Note:  This note was prepared with assistance of Dragon voice recognition software. Occasional wrong-word or sound-a-like substitutions may have occurred due to the inherent limitations of voice recognition software.  Vincent Loteborah Baptiste Littler, DO 09/02/18   Patient Care Team    Relationship Specialty Notifications Start End  Vincent Lotpalski, Jaylyn Iyer, DO PCP - General Family Medicine  08/06/17      -Vitals obtained; medications/ allergies reconciled;  personal medical, social, Sx etc.histories were updated by CMA, reviewed by me and are reflected in chart   Patient Active Problem List   Diagnosis Date Noted  . Gastroparesis 12/03/2017  . Hyperlipidemia 12/03/2017  .  Elevated TSH 12/03/2017  . Vitamin D deficiency 12/03/2017  . Hyperglycemia 12/03/2017  . Allergic contact dermatitis 12/03/2017  . H/O partial seizures 09/24/2017  . Alcoholic cirrhosis (HCC) 08/20/2017  . Insomnia 08/20/2017  . Gastroesophageal reflux disease 08/20/2017  . Decreased breath sounds at right lung base   . Ascites   . Liver failure without hepatic coma (HCC)   . Hypokalemia   . Anxiety   . Alcoholic hepatitis with ascites   . Coagulopathy (HCC) 08/08/2017  . Hyponatremia 08/07/2017  . ADHD (attention deficit hyperactivity disorder) 08/06/2017     Current Meds  Medication Sig  . hydrOXYzine (ATARAX/VISTARIL) 50 MG tablet Take 0.5-1 tablets (25-50 mg total) by mouth at bedtime as needed (sleep). For itching.  . Melatonin 5 MG TABS Take 5 mg by mouth at bedtime as needed (for sleep).  . methylphenidate (RITALIN) 20 MG tablet Take 1.5 tablets (30 mg total) by mouth 2 (two) times daily.  . Multiple Vitamin (MULTIVITAMIN WITH MINERALS) TABS tablet Take 1 tablet by mouth daily.  Marland Kitchen omeprazole (PRILOSEC) 20 MG capsule Take 1 capsule (20 mg total) by mouth daily.  . [DISCONTINUED] hydrOXYzine (ATARAX/VISTARIL) 50 MG tablet Take 0.5-1 tablets (25-50 mg total) by mouth at bedtime as needed (sleep). For itching.  .  [DISCONTINUED] hydrOXYzine (ATARAX/VISTARIL) 50 MG tablet Take 0.5-1 tablets (25-50 mg total) by mouth at bedtime as needed (sleep). For itching.  . [DISCONTINUED] methylphenidate (RITALIN) 20 MG tablet Take 1.5 tablets (30 mg total) by mouth 2 (two) times daily.   Current Facility-Administered Medications for the 09/02/18 encounter (Office Visit) with Vincent Lot, DO  Medication  . 0.9 %  sodium chloride infusion     Allergies:  Allergies  Allergen Reactions  . Amoxicillin Rash     ROS:  See above HPI for pertinent positives and negatives   Objective:   Blood pressure 122/77, pulse 87, temperature 98.7 F (37.1 C), height  (1.702 m).  (if some vitals are omitted, this means that patient was UNABLE to obtain them even though they were asked to get them prior to OV today.  They were asked to call us at their earliest convenience with these once obtained. )  General: A & O * 3; sounds in no acute distress; in usual state of health.  Skin: Pt confirms warm and dry extremities and pink fingertips HEENT: Pt confirms lips non-cyanotic Chest: Patient confirms normal chest excursion and movement Respiratory: speaking in full sentences, no conversational dyspnea; patient confirms no use of accessory muscles Psych: insight appears good, mood- appears full

## 2018-09-21 IMAGING — CT CT HEAD W/O CM
4 series · 17 of 47 positions shown, 19 images · non-contrast
Comparison: None.

CLINICAL DATA: Syncope versus seizure.

EXAM:
CT HEAD WITHOUT CONTRAST
TECHNIQUE: Contiguous axial images were obtained from the base of the skull
through the vertex without intravenous contrast.

[Series 3: head wo · axial · 0.45mm/px · z∈[-59,+61]mm · 7 of 32 slices shown, 9 images]
[im 4/32  brain]
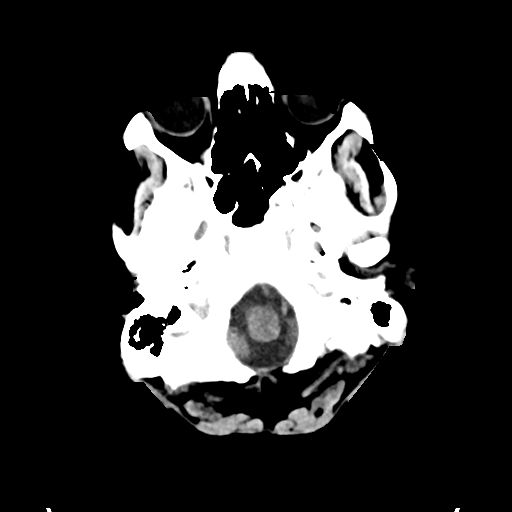
[im 4/32  bone]
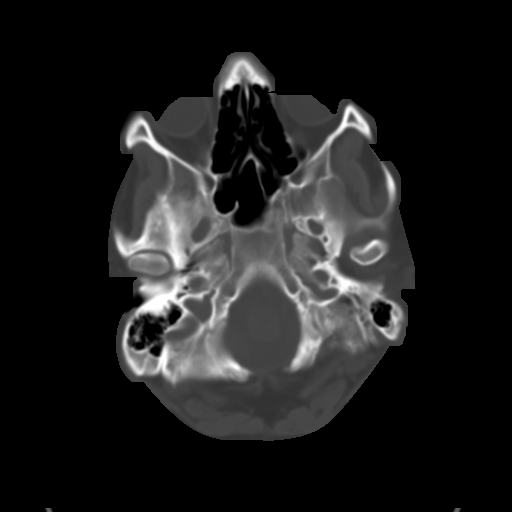
[im 8/32  brain]
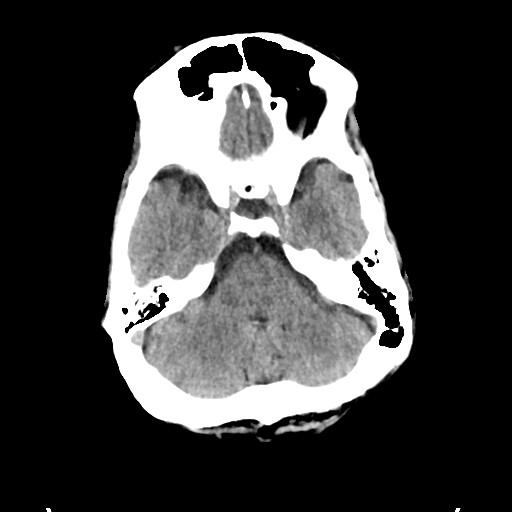
[im 12/32  brain]
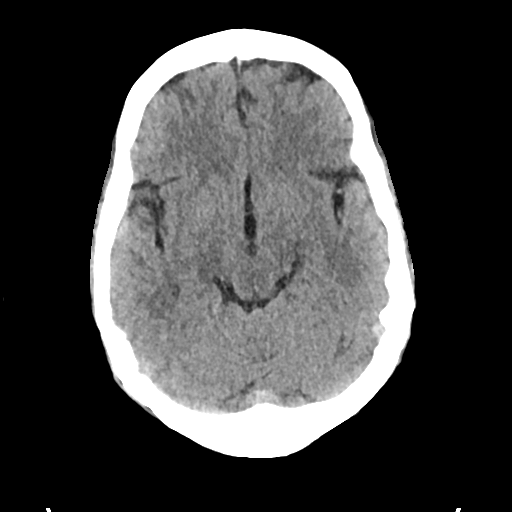
[im 16/32  brain]
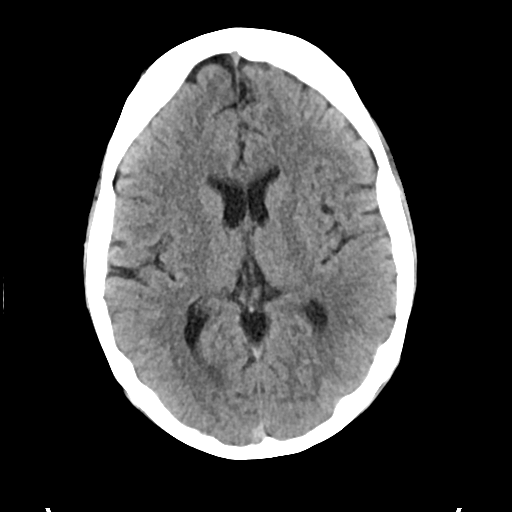
[im 20/32  brain]
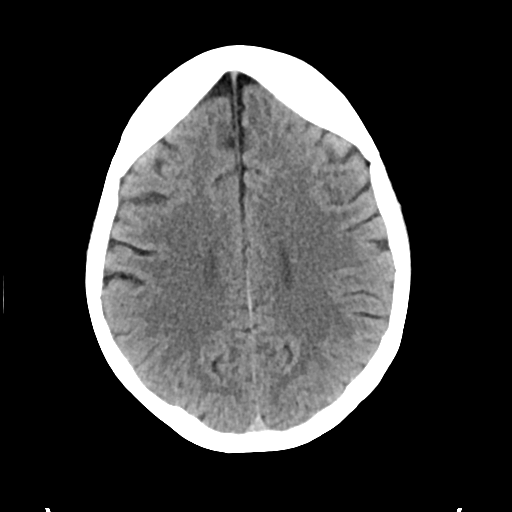
[im 20/32  bone]
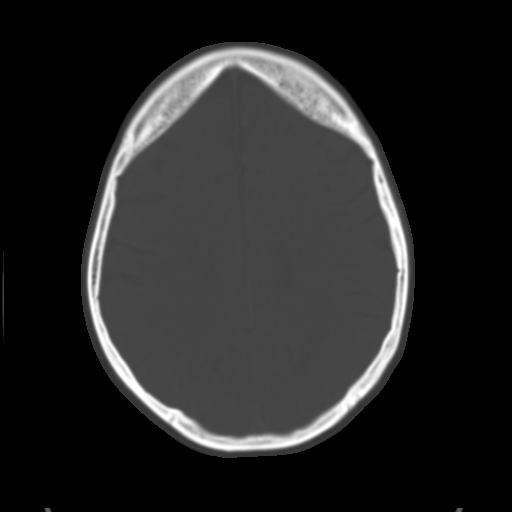
[im 24/32  brain]
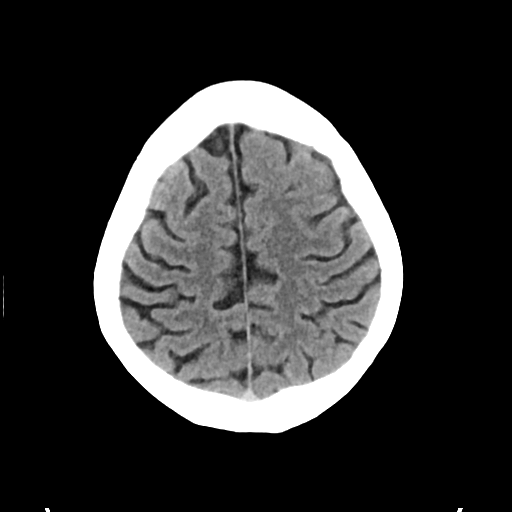
[im 28/32  brain]
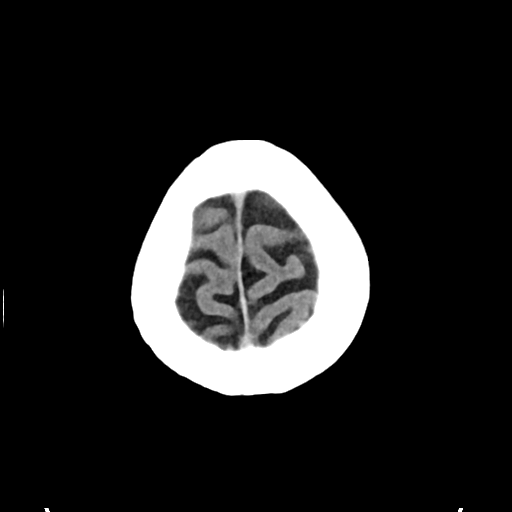

[Series 4: head bone · axial · 0.45mm/px · z∈[-60,-6]mm · 4 of 78 slices shown]
[im 8/78  bone]
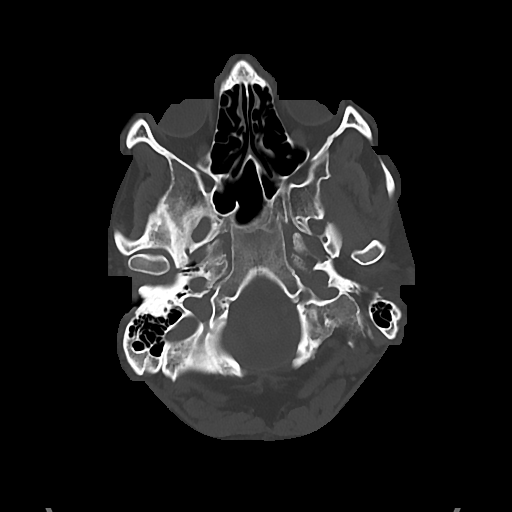
[im 16/78  bone]
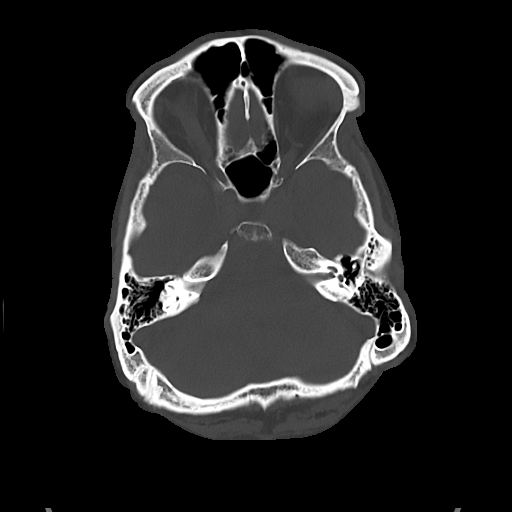
[im 24/78  bone]
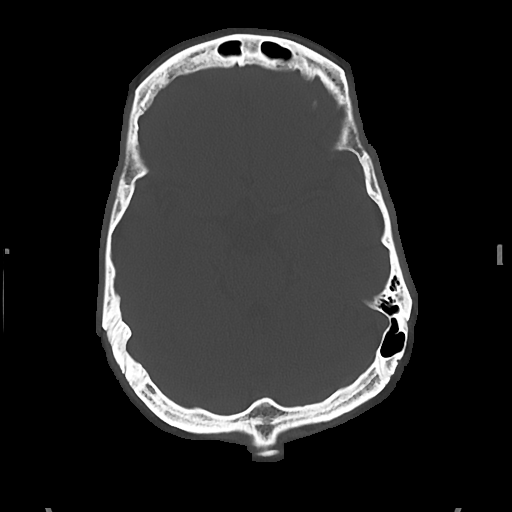
[im 35/78  bone]
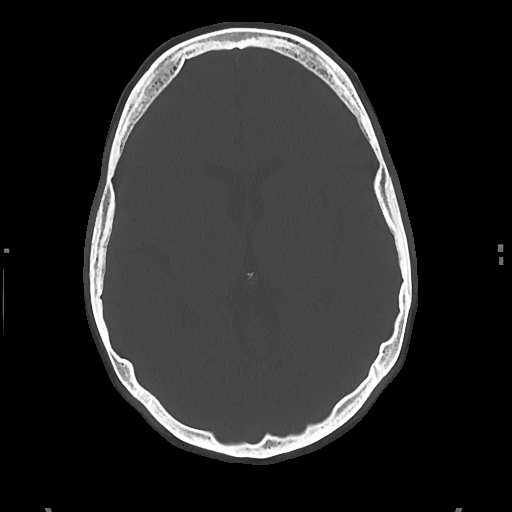

[Series 5: cor soft · coronal · 0.30mm/px · 3 of 69 slices shown]
[im 23/69  brain]
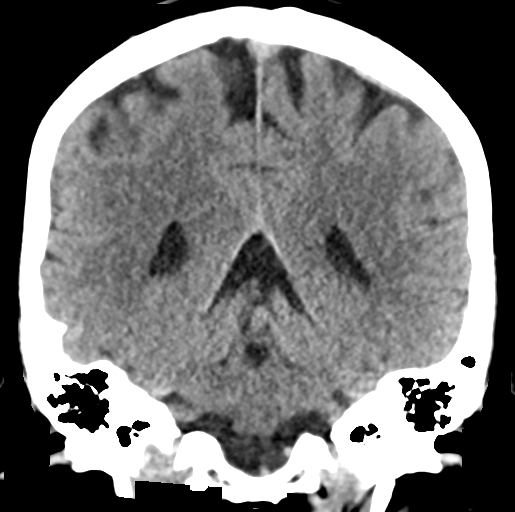
[im 31/69  brain]
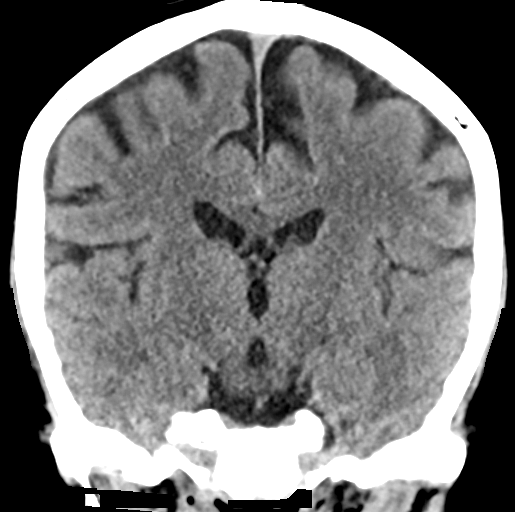
[im 38/69  brain]
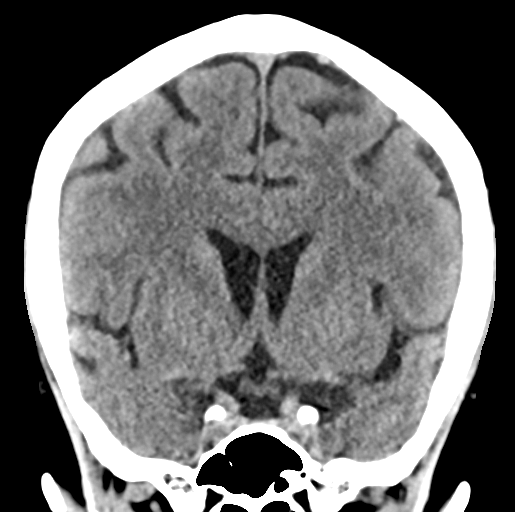

[Series 6: sag soft · sagittal · 0.30mm/px · 3 of 50 slices shown]
[im 17/50  brain]
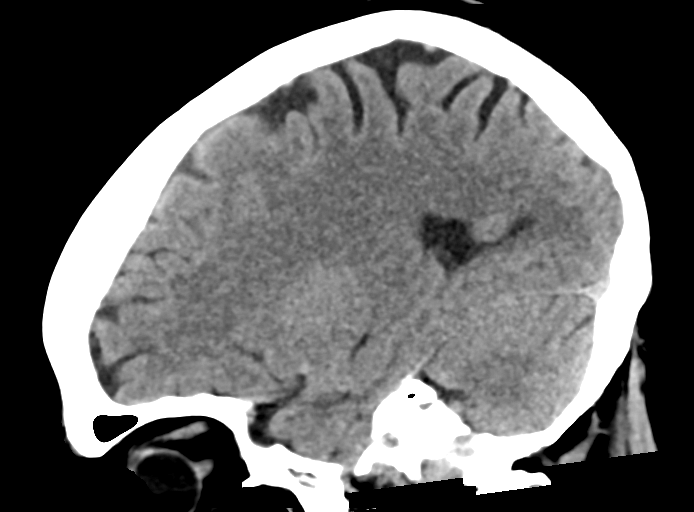
[im 25/50  brain]
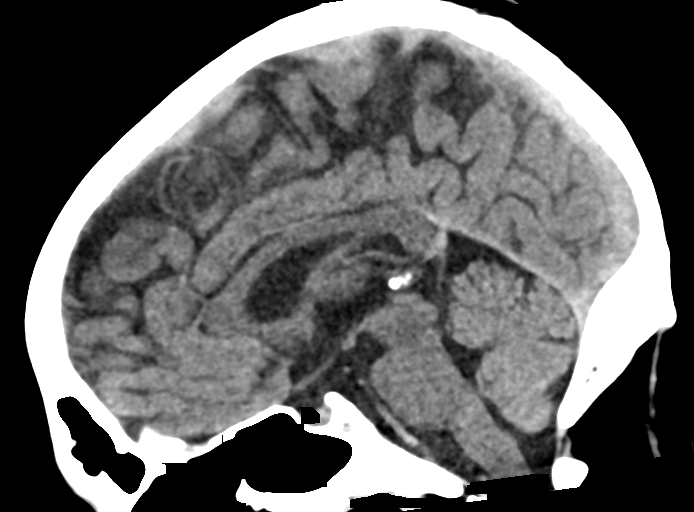
[im 33/50  brain]
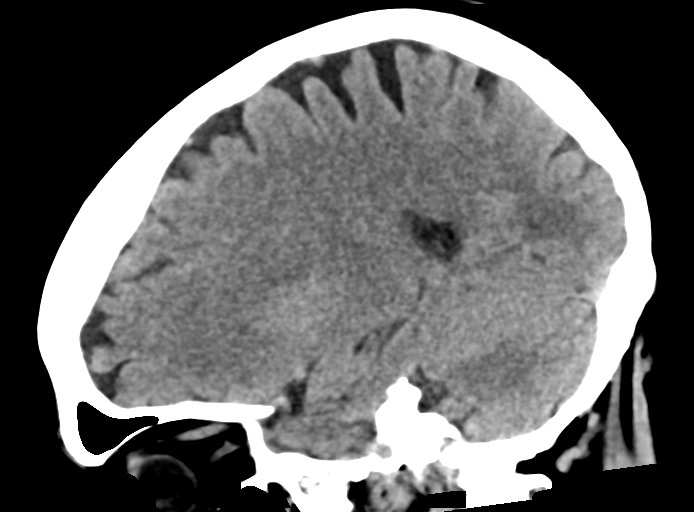

[17 of 47 positions shown; findings below may reference images not displayed]

FINDINGS: Brain: No evidence of acute infarction, hemorrhage, hydrocephalus,
extra-axial collection or mass lesion/mass effect.

Vascular: No hyperdense vessel or unexpected calcification.

Skull: Normal. Negative for fracture or focal lesion.

Sinuses/Orbits: No acute finding.

Other: None.
IMPRESSION: 1. Normal noncontrast head CT.

## 2018-10-21 DIAGNOSIS — Z1159 Encounter for screening for other viral diseases: Secondary | ICD-10-CM | POA: Diagnosis not present

## 2018-10-31 DIAGNOSIS — R112 Nausea with vomiting, unspecified: Secondary | ICD-10-CM | POA: Diagnosis not present

## 2018-10-31 DIAGNOSIS — R110 Nausea: Secondary | ICD-10-CM | POA: Diagnosis not present

## 2018-10-31 DIAGNOSIS — Z20828 Contact with and (suspected) exposure to other viral communicable diseases: Secondary | ICD-10-CM | POA: Diagnosis not present

## 2018-10-31 DIAGNOSIS — J029 Acute pharyngitis, unspecified: Secondary | ICD-10-CM | POA: Diagnosis not present

## 2018-11-24 ENCOUNTER — Other Ambulatory Visit: Payer: Self-pay | Admitting: Family Medicine

## 2018-12-02 ENCOUNTER — Encounter: Payer: Self-pay | Admitting: Family Medicine

## 2018-12-02 ENCOUNTER — Ambulatory Visit (INDEPENDENT_AMBULATORY_CARE_PROVIDER_SITE_OTHER): Payer: BC Managed Care – PPO | Admitting: Family Medicine

## 2018-12-02 ENCOUNTER — Other Ambulatory Visit: Payer: Self-pay

## 2018-12-02 VITALS — BP 153/95 | HR 100 | Temp 99.0°F | Ht 67.0 in | Wt 205.3 lb

## 2018-12-02 DIAGNOSIS — F5101 Primary insomnia: Secondary | ICD-10-CM

## 2018-12-02 DIAGNOSIS — Z8669 Personal history of other diseases of the nervous system and sense organs: Secondary | ICD-10-CM | POA: Diagnosis not present

## 2018-12-02 DIAGNOSIS — R7989 Other specified abnormal findings of blood chemistry: Secondary | ICD-10-CM | POA: Diagnosis not present

## 2018-12-02 DIAGNOSIS — K3184 Gastroparesis: Secondary | ICD-10-CM

## 2018-12-02 DIAGNOSIS — E876 Hypokalemia: Secondary | ICD-10-CM

## 2018-12-02 DIAGNOSIS — F902 Attention-deficit hyperactivity disorder, combined type: Secondary | ICD-10-CM

## 2018-12-02 DIAGNOSIS — F10239 Alcohol dependence with withdrawal, unspecified: Secondary | ICD-10-CM

## 2018-12-02 DIAGNOSIS — F10231 Alcohol dependence with withdrawal delirium: Secondary | ICD-10-CM

## 2018-12-02 DIAGNOSIS — F10931 Alcohol use, unspecified with withdrawal delirium: Secondary | ICD-10-CM

## 2018-12-02 DIAGNOSIS — K7210 Chronic hepatic failure without coma: Secondary | ICD-10-CM

## 2018-12-02 DIAGNOSIS — F10939 Alcohol use, unspecified with withdrawal, unspecified: Secondary | ICD-10-CM

## 2018-12-02 DIAGNOSIS — K7030 Alcoholic cirrhosis of liver without ascites: Secondary | ICD-10-CM | POA: Diagnosis not present

## 2018-12-02 DIAGNOSIS — K219 Gastro-esophageal reflux disease without esophagitis: Secondary | ICD-10-CM

## 2018-12-02 DIAGNOSIS — F419 Anxiety disorder, unspecified: Secondary | ICD-10-CM

## 2018-12-02 DIAGNOSIS — E871 Hypo-osmolality and hyponatremia: Secondary | ICD-10-CM

## 2018-12-02 DIAGNOSIS — E559 Vitamin D deficiency, unspecified: Secondary | ICD-10-CM

## 2018-12-02 DIAGNOSIS — E785 Hyperlipidemia, unspecified: Secondary | ICD-10-CM

## 2018-12-02 DIAGNOSIS — K7011 Alcoholic hepatitis with ascites: Secondary | ICD-10-CM

## 2018-12-02 DIAGNOSIS — R112 Nausea with vomiting, unspecified: Secondary | ICD-10-CM

## 2018-12-02 DIAGNOSIS — R739 Hyperglycemia, unspecified: Secondary | ICD-10-CM

## 2018-12-02 MED ORDER — ONDANSETRON 8 MG PO TBDP: 8.0000 mg | ORAL_TABLET | Freq: Three times a day (TID) | ORAL | 0 refills | Status: DC | PRN

## 2018-12-02 MED ORDER — PROPRANOLOL HCL 10 MG PO TABS: 10.0000 mg | ORAL_TABLET | Freq: Three times a day (TID) | ORAL | 0 refills | Status: DC

## 2018-12-02 MED ORDER — OMEPRAZOLE 20 MG PO CPDR: DELAYED_RELEASE_CAPSULE | ORAL | 0 refills | Status: DC

## 2018-12-02 MED ORDER — ALPRAZOLAM 0.5 MG PO TABS: 0.2500 mg | ORAL_TABLET | Freq: Two times a day (BID) | ORAL | 0 refills | Status: AC | PRN

## 2018-12-02 MED ORDER — METHYLPHENIDATE HCL 20 MG PO TABS: 30.0000 mg | ORAL_TABLET | Freq: Two times a day (BID) | ORAL | 0 refills | Status: DC

## 2018-12-02 MED ORDER — HYDROXYZINE HCL 50 MG PO TABS: 25.0000 mg | ORAL_TABLET | Freq: Every evening | ORAL | 0 refills | Status: DC | PRN

## 2018-12-02 MED ORDER — ALPRAZOLAM 0.5 MG PO TABS: 0.2500 mg | ORAL_TABLET | Freq: Two times a day (BID) | ORAL | 0 refills | Status: DC | PRN

## 2018-12-02 NOTE — Patient Instructions (Signed)
Alcohol Withdrawal Syndrome Alcohol withdrawal syndrome is a group of symptoms that can develop when a person who drinks heavily and regularly stops drinking or drinks less. Alcohol withdrawal syndrome can be mild or severe, and it may even be life-threatening. Alcohol withdrawal syndrome usually affects people who have alcohol use disorder, which may also be called alcoholism. Alcohol use disorder is when a person is unable to control his or her alcohol use, and drinking too much or too often causes problems at home, at work, or in relationships. What are the causes? Drinking heavily and drinking on a regular basis cause changes in brain chemistry. Over time, the body becomes dependent on alcohol. When alcohol use stops, the chemistry system in the brain becomes unbalanced and causes the symptoms of alcohol withdrawal. What increases the risk? Alcohol withdrawal syndrome is more likely to occur in people who drink more than the recommended limit of alcohol (2 drinks a day for men or 1 drink a day for non-pregnant women). It is also more likely to affect heavy drinkers who have been using alcohol for long periods of time. The more a person drinks and the longer he or she drinks, the greater the risk of alcohol withdrawal syndrome. Severe withdrawal is more likely to develop in someone who:  Had severe alcohol withdrawal in the past.  Had a seizure during a previous episode of alcohol withdrawal.  Is elderly.  Uses other drugs.  Has a long-term (chronic) medical problem, such as heart, lung, or liver disease.  Has depression.  Does not get enough nutrients from his or her diet (malnutrition). What are the signs or symptoms? Symptoms of this condition can be mild to moderate, or they can be severe. Symptoms may develop a few hours (or up to a day) after a person changes his or her drinking patterns. During the 48 hours after he or she has stopped drinking, the following symptoms may go away or  get better:  Uncontrollable shaking (tremor).  Sweating.  Headache.  Anxiety.  Inability to relax (agitation).  Trouble sleeping (insomnia).  Irregular heartbeats (palpitations).  Alcohol cravings.  Seizure. The following symptoms may get worse 24-48 hours after a person has decreased or stopped alcohol use, and they may gradually improve over a period of days or weeks:  Nausea and vomiting.  Fatigue.  Sensitivity to light and sounds.  Confusion and inability to think clearly.  Loss of appetite.  Mood swings, irritability, depression, and anxiety.  Insomnia and nightmares. The following symptoms are severe and life-threatening. When these symptoms occur together, they are called delirium tremens (DTs):  High blood pressure.  Increased heart rate.  Trouble breathing.  Seizures. These may go away along with other symptoms, or they may persist.  Seeing, hearing, feeling, smelling, or tasting things that are not there (hallucinations). If you experience hallucinations, they usually begin 12-24 hours after a change in drinking patterns. Delirium tremens requires immediate hospitalization. How is this diagnosed? This condition may be diagnosed based on:  Your symptoms and medical history.  Your history of alcohol use. Your health care provider may ask questions about your drinking behavior. It is important to be honest when you answer these questions.  A psychological assessment.  A physical exam.  Blood tests or urine tests to measure blood alcohol level and to rule out other causes of symptoms.  MRI or CT scan. This may be done if you seem to have abnormal thinking or behaviors (altered mental status). Diagnosis can be difficult.  People going through withdrawal often avoid seeking medical care and are not thinking clearly. Friends and family members play an important role in recognizing symptoms and encouraging loved ones to get treatment. How is this treated?  Most people with symptoms of withdrawal can be treated outside of a hospital setting (outpatient treatment), with close monitoring such as daily check-ins with a health care provider and counseling. You may need treatment at a hospital or treatment center (inpatient treatment) if:  You have a history of delirium tremens or seizures.  You have severe symptoms.  You are addicted to other drugs.  You cannot swallow medicine.  You have a serious medical condition such as heart failure.  You experienced withdrawal in the past but then you continued drinking alcohol.  You are not likely to commit to an outpatient treatment schedule. Treatment may involve:  Monitoring your blood pressure, pulse, and breathing.  IV fluids to keep you hydrated.  Medicines to reduce withdrawal symptoms and discomfort (benzodiazepines).  Medicine to reduce anxiety.  Medicine to prevent or control seizures.  Multivitamins and B vitamins.  Having a health care provider check on you daily. It is important to get treatment for alcohol withdrawal early. Getting treatment early can:  Speed up your recovery from withdrawal symptoms.  Make you more successful with long-term stoppage of alcohol use (sobriety). If you need help to stop drinking, your health care provider may recommend a long-term treatment plan that includes:  Medicines to help treat alcohol use disorder.  Substance abuse counseling.  Support groups. Follow these instructions at home:   Take over-the-counter and prescription medicines (including vitamin supplements) only as told by your health care provider.  Do not drink alcohol.  Do not drive until your health care provider approves.  Have someone you trust stay with you or be available if you need help with your symptoms or with not drinking.  Drink enough fluid to keep your urine pale yellow.  Consider joining an alcohol support group or treatment program. These can provide  emotional support, advice, and guidance.  Keep all follow-up visits as told by your health care provider. This is important. Contact a health care provider if:  Your symptoms get worse instead of better.  You cannot eat or drink without vomiting.  You are struggling with not drinking alcohol.  You cannot stop drinking alcohol. Get help right away if:  You have an irregular heartbeat.  You have chest pain.  You have trouble breathing.  You have a seizure for the first time.  You hallucinate.  You become very confused. Summary  Alcohol withdrawal is a group of symptoms that can develop when a person who drinks heavily and regularly stops drinking or drinks less.  Symptoms of this condition can be mild to moderate, or they can be severe.  Treatment may include hospitalization, medicine, and counseling. This information is not intended to replace advice given to you by your health care provider. Make sure you discuss any questions you have with your health care provider. Document Released: 01/17/2005 Document Revised: 03/22/2017 Document Reviewed: 12/14/2016 Elsevier Patient Education  2020 [employer]. Delirium Tremens Delirium tremens, also known as DTs, is the most severe form of alcohol withdrawal. Alcohol withdrawal is a group of symptoms that can develop when a person who drinks heavily and regularly stops drinking or drinks less than usual. This condition causes a change in consciousness or awareness (delirium), feeling or seeing things that other people do not feel or see (hallucinations),  and changes in heart rate and rhythm, body temperature, oxygen levels, and blood pressure (vital signs). DTs is an emergency and can be life-threatening. It must be treated in a hospital or a treatment center. What are the causes? This condition is caused when you drink heavily and regularly and then stop drinking or reduce the amount you drink. Heavy and regular drinking can cause  chemicals that send signals from the brain to the body (neurotransmitters) to deactivate. Alcohol withdrawal, including DTs, develops when these deactivated neurotransmitters reactivate because you stop drinking or drink less than usual. This causes the brain to become overactive. What increases the risk? The more a person drinks and the longer he or she drinks, the greater the risk of DTs. This condition is more likely to develop in people who have a history of drinking heavily and:  Have had severe alcohol withdrawal or DTs in the past.  Have had a seizure during a previous episode of alcohol withdrawal.  Are elderly.  Are pregnant.  Use illegal drugs.  Take medicines to help them relax (sedatives).  Have certain medical problems, including: ? Infections, such as pneumonia. ? Heart, lung, or liver disease. ? A history of seizures. ? Behavioral health conditions. What are the signs or symptoms? Early symptoms of alcohol withdrawal happen before symptoms of DTs. These symptoms may start within 6 hours after the most recent drink, and they may last for 5-7 days. Early symptoms of alcohol withdrawal may include:  Problems sleeping (insomnia).  Anxiety.  Problems thinking clearly.  Irritability.  Fast breathing.  Headache.  Nausea and vomiting.  Fever.  Excessive sweating.  A heartbeat that feels irregular or faster than normal.  Involuntary shaking or trembling of a body part (tremors). Symptoms of DTs may begin after early withdrawal symptoms. These symptoms may start to develop 3 days after the most recent drink. They may last for up to 8 days. Symptoms of DTs may include:  Changes in attention and awareness. These changes may be quick and can vary.  Sudden changes in a person's vital signs. Most often, the heart rate and body temperature will rise.  Hallucinations.  Extreme sleepiness and difficulty being awakened.  Extreme confusion.  Seizures. How is this  diagnosed? This condition may be diagnosed based on:  Your medical history.  Having a history of using alcohol and then stopping using it.  Your symptoms.  Blood tests.  Urine tests.  A physical exam. Your health care provider may check for: ? Fever. ? High blood pressure. ? Rapid or irregular heart rate. ? Overactive reflexes. ? Tremors. ? Problems thinking clearly. ? Hallucinations. How is this treated? Treatment for this condition is done in a hospital or treatment center. It may include:  Monitoring of your vital signs.  Fluids and minerals (electrolytes) given through an IV.  Sedatives.  Medicines that reduce anxiety.  Medicines to control your blood pressure.  Medicines to prevent or control seizures.  Multivitamins and B vitamins.  Nutrition by mouth or, if needed, through an IV.  Anti-nausea medicines. Ongoing counseling and behavioral health support are an important part of treatment if you have a history of alcohol abuse or if you use illegal drugs. Follow these instructions at home: Medicines  Take over-the-counter and prescription medicines only as told by your health care provider.  If you were prescribed sedatives, take them only as told by your health care provider. Eating and drinking   Do not drink alcohol.  Drink enough fluid to  keep your urine pale yellow. General instructions  Do not drive or operate heavy machinery until your health care provider approves.  Do not try to withdraw from heavy, prolonged use of alcohol without help from a health care provider.  Have someone stay with you in case you need help.  Consider joining a 12-step program or another alcohol support group.  Keep all follow-up visits as told by your health care provider. This is important. Contact a health care provider if you:  Have symptoms of alcohol withdrawal that get worse or do not go away.  Feel depressed.  Start drinking alcohol again (relapse).   Cannot eat or drink without vomiting.  Are not able to stop drinking alcohol. Get help right away if you:  Feel you may hurt yourself or others.  Have a seizure.  Have a fever along with other symptoms of alcohol withdrawal. These symptoms may include shakiness, irritability, insomnia, nausea, and vomiting.  Become extremely confused.  Have hallucinations.  Become extremely sleepy and have difficulty staying awake.  Vomit blood.  Have extreme tremors or sweating.  Have a heartbeat that feels irregular or faster than normal.  Have chest pain.  Have difficulty breathing. If you ever feel like you may hurt yourself or others, or have thoughts about taking your own life, get help right away. You can go to your nearest emergency department or call:  Your local emergency services (911 in the [place]  A suicide crisis helpline, such as the National Suicide Prevention Lifeline at 91652568041-540 359 4170. This is open 24 hours a day. These symptoms may represent a serious problem that is an emergency. Do not wait to see if the symptoms will go away. Get medical help right away. Call your local emergency services (911 in the [place] Do not drive yourself to the hospital. Summary  Delirium tremens, also known as DTs, is the most severe form of alcohol withdrawal.  This condition causes a change in consciousness or awareness (delirium), feeling or seeing things that other people do not feel or see (hallucinations), and changes in heart rate and rhythm, body temperature, oxygen levels, and blood pressure (vital signs).  DTs is an emergency and can be life-threatening. It must be treated in a hospital or a treatment center.  The more a person drinks and the longer he or she drinks, the greater the risk of DTs.  Contact a health care provider if you start drinking alcohol again (relapse). This information is not intended to replace advice given to you by your health care provider. Make sure you  discuss any questions you have with your health care provider. Document Released: 04/09/2005 Document Revised: 04/28/2018 Document Reviewed: 04/28/2018 Elsevier Patient Education  2020 [employer]

## 2018-12-02 NOTE — Progress Notes (Signed)
ADHD/ ADD OV note  Impression and Recommendations:    1. Alcohol withdrawal syndrome with complication (HCC)   2. Alcoholic cirrhosis, unspecified whether ascites present (HCC)   3. significant concern for Delirium tremens (HCC)   4. H/O partial seizures   5. Attention deficit hyperactivity disorder (ADHD), combined type   6. Primary insomnia   7. Non-intractable vomiting with nausea, unspecified vomiting type   8. Gastroesophageal reflux disease, esophagitis presence not specified   9. Gastroparesis   10. Elevated TSH   11. Hyperlipidemia, unspecified hyperlipidemia type   12. Vitamin D deficiency   13. Hyperglycemia   14. Hypokalemia   15. Anxiety   16. Alcoholic hepatitis with ascites   - Last visit 09/02/2018   ** Alcohol Withdrawal Syndrome w significant concern for Delirium Tremens Alcohol withdrawal syndrome with complication (HCC);  -  H/O partial seizures;   -  known Alcoholic cirrhosis, w/o ascites present (HCC)  - Need for lab work today to assess Na level, LFT etc etc - See orders.  - Advised patient that we can help him get through his current relapse.  He is already having w/d sx if he doesn't drink everyday- He is VERY high risk for DT, withdrawal Sz etc  - Pt absolutely declines going to a treatment center for inpatient assessment and eval  -  Patient states he works 3 more days this week then has 10 days off for vacation.  He wishes to go through his withdrawal while on vacation with his wife who is a physical therapist- has baseline medical knowledge  - Discussed prescribing short-term medicines to help-short-term benzodiazepine to prevent seizure disorder.  Told patient to start them approximately 8 hours after he decides to stop his drinking continue for the next 3 to 4 days and possibly longer. -Extensive discussion had with patient regarding risks of condition and these medicines as well as dealing with this in outpatient setting.    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  He assumes risks and will not do inpatient treatment -Told patient I would call and discuss this further with his wife so she understands what to look for in terms of red flag symptoms and when she should proceed to dial 911 or bring patient to ER if these sx develope - Extensive education provided to patient today.   -  Advice on actions to try to maintain sobriety at this time and STRONGLY rec counseling as outpt.   - Advised patient that he needs to think of this next week off as rehabilitation. - Discussed need for formal rehabilitation in future if patient cannot overcome acute crisis. - Reviewed the critical importance of discontinuing and avoiding alcohol use entirely. - Advised patient to be honest with himself about the damage he is causing to his body, and also be honest with his wife, whom I know. - Emphasized importance of establishing with a support system where [place] in [place]- recently moved sev mo ago. - Discussed need to establish with a counselor/ life coach in [place]. - told pt I will call him over this wkend a couple of times to check on him   ADHD Management - Stable at this time. -  Continue treatment plan as prescribed.  See med list below. - Patient tolerating meds well without complication.  Denies S-E  - Will continue to monitor.   Primary Insomnia - Stable on current management. - Continue treatment plan as prescribed. - Will continue to monitor.   Mood Management - Says that Buspar made him too sleepy in the past. - Xanax prescribed today.  See med list.  - Reviewed the "spokes of the wheel" of mood and health management.  Stressed the  importance of ongoing prudent habits, including regular exercise, appropriate sleep hygiene, healthful dietary habits, and prayer/meditation to relax.  - Discussed need to engage patient in pleasurable activities to "replace" his addictive impulses.  - Will continue to monitor.   GERD - Continue omeprazole daily.  See med list. - Will continue to monitor.   Health Education & Counseling - Advised patient to continue working toward exercising to improve overall mental, physical, and emotional health.    - Encouraged patient to engage in daily physical activity, especially a formal exercise routine.  Recommended that the patient eventually strive for at least 150 minutes of moderate cardiovascular activity per week according to guidelines established by the [facility].   - Healthy dietary habits encouraged, including low-carb, and high amounts of lean protein in diet.   - Patient should also consume adequate amounts of water.  - Return for DOXY video visit in two weeks.    Education and routine counseling performed. Handouts provided if pt desired.    Orders Placed This Encounter  Procedures   CBC with Differential/Platelet   Comprehensive metabolic panel   Hemoglobin A1c   Amylase   Magnesium   TSH   T4, free   VITAMIN D 25 Hydroxy (Vit-D Deficiency, Fractures)   Lactate dehydrogenase   Lipid panel   Direct LDL   Lactate dehydrogenase    Medications Discontinued During This Encounter  Medication Reason   busPIRone (BUSPAR) 10 MG tablet Patient has not taken in last 30 days   omeprazole (PRILOSEC) 20 MG capsule    methylphenidate (RITALIN) 20 MG tablet Reorder   ondansetron (ZOFRAN-ODT) 8 MG disintegrating tablet Reorder   hydrOXYzine (ATARAX/VISTARIL) 50 MG tablet Reorder   ALPRAZolam (XANAX) 0.5 MG tablet      Orders Placed This Encounter  Procedures   CBC with Differential/Platelet   Comprehensive metabolic panel   Hemoglobin A1c   Amylase     Magnesium   TSH   T4, free   VITAMIN D 25 Hydroxy (Vit-D Deficiency, Fractures)   Lactate dehydrogenase   Lipid panel   Direct LDL   Lactate dehydrogenase     Return for doxy video visit in 2wks.   -Reminded patient the need for yearly complete physical exam office visits in addition to office visits for management of the chronic diseases  -Gross side effects, risk and benefits, and alternatives of medications discussed with patient.  Patient is aware that all medications have potential side effects and we are unable to predict every side effect or drug-drug interaction that may occur.  Expresses verbal understanding and consents to current therapy plan and treatment regimen.   Please see AVS handed out to patient at the end of our visit for further patient instructions/ counseling done pertaining to today's office visit.    Note:  This document was prepared using Dragon voice recognition software and may include unintentional dictation errors.  This document serves as a record of services personally performed by Thomasene Loteborah Ramy Greth, DO. It was created  on her behalf by Peggye FothergillKatherine Galloway, a trained medical scribe. The creation of this record is based on the scribe's personal observations and the provider's statements to them.   I have reviewed the above medical documentation for accuracy and completeness and I concur.  Thomasene Loteborah Talor Desrosiers, DO 12/05/2018 6:42 PM       ______________________________________________________________________    Subjective:  HPI: Vincent ShinerBrian Budzyn33 y.o. male  presents for 3 month follow up for evaluation of our treatment plan for pt's ADD/ ADHD.  Says he's doing good.  Feels things have been good overall.  Continues on omeprazole once daily.  ADHD Management Feels his Ritalin is working well.  Isn't sure if his Ritalin is helping him get through the day, but he ran out of it a week or two ago.   Relapse of Alcohol Use - IN Acute Crisis  and having adverse sx when he tries to not drink for 24hrs or more   In early July, he took a week of vacation, and says "old Arlys JohnBrian came back" over that time.  Says "I don't know where it came from; [drinking] just seemed like a good idea at the time."  He isn't sure why he began drinking again.  He was drinking 12 per day during that week off.  He's been trying to wean himself back down, from 2-4 per day to 2-4 per week.  Notes he has been going through withdrawals.  States "I was fine yesterday."  Feels like he's doing better now than he was this past weekend.  He has gone 24 hours without drinking.  Hasn't had any drinks today.  Confirms that his wife is worried about his health.  [place] in [place], patient notes he does not have support aside from his wife.  Says he was feeling so good for so long, but is only worried now that he's resumed drinking again.  [place], to celebrate their anniversary.  Says things have been good with Noelle overall.  He's been trying to be more open with her and communicating about his stresses and concerns.  Says Altamease Oileroelle found his stash in the car, so she knows what's going on.  Denies heart concerns overall, but thinks that his blood pressure is high due to the recent alcohol ause nd withdrawals.  States it usually runs in the 120's/80's.  Denies having new edema or swelling since relapse.  Hasn't been feeling as hungry lately due to nausea.    No problems updated.   Wt Readings from Last 3 Encounters:  12/02/18 205 lb 4.8 oz (93.1 kg)  09/11/17 165 lb (74.8 kg)    BMI Readings from Last 3 Encounters:  12/02/18 32.15 kg/m  09/11/17 25.09 kg/m    BP Readings from Last 3 Encounters:  12/02/18 (!) 153/95  09/11/17 117/76  08/11/17 108/71     Review of Systems: General:   No F/C, wt loss Pulm:   No DIB, SOB, pleuritic chest pain Card:  No CP, palpitations Abd:  No n/v/d or pain Ext:  No inc edema  from baseline   Objective: Physical Exam: BP (!) 153/95    Pulse 100    Temp 99 F (37.2 C)    Ht 5\' 7"  (1.702 m)    Wt 205 lb 4.8 oz (93.1 kg)    SpO2 100%    BMI 32.15 kg/m  Body mass index is 32.15 kg/m. General: Well nourished, in no apparent distress. Eyes: PERRLA,  EOMs, conjunctiva clr no swelling or erythema Neck: supple Resp: Respiratory effort- normal, ECTA B/L w/o W/R/R  Cardio: RRR w/o MRGs. Skin: Warm, dry without rashes, lesions, ecchymosis.  Neuro: Alert, Oriented Psych: Normal affect, Insight and Judgment appropriate.    Current Medications:  Current Outpatient Medications on File Prior to Visit  Medication Sig Dispense Refill   Melatonin 5 MG TABS Take 5 mg by mouth at bedtime as needed (for sleep).     Multiple Vitamin (MULTIVITAMIN WITH MINERALS) TABS tablet Take 1 tablet by mouth daily. 30 tablet 0   Current Facility-Administered Medications on File Prior to Visit  Medication Dose Route Frequency Provider Last Rate Last Dose   0.9 %  sodium chloride infusion  500 mL Intravenous Once Armbruster, Carlota Raspberry, MD        Medical History:  Patient Active Problem List   Diagnosis Date Noted   Gastroparesis 12/03/2017   Hyperlipidemia 12/03/2017   Elevated TSH 12/03/2017   Vitamin D deficiency 12/03/2017   Hyperglycemia 12/03/2017   Allergic contact dermatitis 12/03/2017   H/O partial seizures 68/03/7516   Alcoholic cirrhosis ([place]) 00/17/4944   Insomnia 08/20/2017   Gastroesophageal reflux disease 08/20/2017   Ascites    Liver failure without hepatic coma (HCC)    Hypokalemia    Anxiety    Alcoholic hepatitis with ascites    Coagulopathy ([place]) 08/08/2017   Hyponatremia 08/07/2017   ADHD (attention deficit hyperactivity disorder) 08/06/2017    Allergies:  Allergies  Allergen Reactions   Amoxicillin Rash     Family history-  Reviewed; changed as appropriate  Social history-  Reviewed; changed as appropriate

## 2018-12-03 ENCOUNTER — Other Ambulatory Visit: Payer: Self-pay | Admitting: Family Medicine

## 2018-12-03 DIAGNOSIS — F902 Attention-deficit hyperactivity disorder, combined type: Secondary | ICD-10-CM

## 2018-12-03 LAB — CBC WITH DIFFERENTIAL/PLATELET
Basophils Absolute: 0.1 10*3/uL (ref 0.0–0.2)
Basos: 1 %
EOS (ABSOLUTE): 0.1 10*3/uL (ref 0.0–0.4)
Eos: 1 %
Hematocrit: 48 % (ref 37.5–51.0)
Hemoglobin: 16.2 g/dL (ref 13.0–17.7)
Immature Grans (Abs): 0 10*3/uL (ref 0.0–0.1)
Immature Granulocytes: 0 %
Lymphocytes Absolute: 1.9 10*3/uL (ref 0.7–3.1)
Lymphs: 25 %
MCH: 30.7 pg (ref 26.6–33.0)
MCHC: 33.8 g/dL (ref 31.5–35.7)
MCV: 91 fL (ref 79–97)
Monocytes Absolute: 0.6 10*3/uL (ref 0.1–0.9)
Monocytes: 8 %
Neutrophils Absolute: 4.9 10*3/uL (ref 1.4–7.0)
Neutrophils: 65 %
Platelets: 247 10*3/uL (ref 150–450)
RBC: 5.27 x10E6/uL (ref 4.14–5.80)
RDW: 13.5 % (ref 11.6–15.4)
WBC: 7.6 10*3/uL (ref 3.4–10.8)

## 2018-12-03 LAB — LDL CHOLESTEROL, DIRECT: LDL Direct: 74 mg/dL (ref 0–99)

## 2018-12-03 LAB — COMPREHENSIVE METABOLIC PANEL
ALT: 178 IU/L — ABNORMAL HIGH (ref 0–44)
AST: 276 IU/L — ABNORMAL HIGH (ref 0–40)
Albumin/Globulin Ratio: 2.3 — ABNORMAL HIGH (ref 1.2–2.2)
Albumin: 5 g/dL (ref 4.0–5.0)
Alkaline Phosphatase: 141 IU/L — ABNORMAL HIGH (ref 39–117)
BUN/Creatinine Ratio: 5 — ABNORMAL LOW (ref 9–20)
BUN: 5 mg/dL — ABNORMAL LOW (ref 6–20)
Bilirubin Total: 2 mg/dL — ABNORMAL HIGH (ref 0.0–1.2)
CO2: 26 mmol/L (ref 20–29)
Calcium: 10.2 mg/dL (ref 8.7–10.2)
Chloride: 90 mmol/L — ABNORMAL LOW (ref 96–106)
Creatinine, Ser: 1.1 mg/dL (ref 0.76–1.27)
GFR calc Af Amer: 100 mL/min/{1.73_m2} (ref >59)
GFR calc non Af Amer: 87 mL/min/{1.73_m2} (ref >59)
Globulin, Total: 2.2 g/dL (ref 1.5–4.5)
Glucose: 114 mg/dL — ABNORMAL HIGH (ref 65–99)
Potassium: 4.4 mmol/L (ref 3.5–5.2)
Sodium: 135 mmol/L (ref 134–144)
Total Protein: 7.2 g/dL (ref 6.0–8.5)

## 2018-12-03 LAB — HEMOGLOBIN A1C
Est. average glucose Bld gHb Est-mCnc: 97 mg/dL
Hgb A1c MFr Bld: 5 % (ref 4.8–5.6)

## 2018-12-03 LAB — T4, FREE: Free T4: 1.61 ng/dL (ref 0.82–1.77)

## 2018-12-03 LAB — VITAMIN D 25 HYDROXY (VIT D DEFICIENCY, FRACTURES): Vit D, 25-Hydroxy: 38.4 ng/mL (ref 30.0–100.0)

## 2018-12-03 LAB — LIPID PANEL
Chol/HDL Ratio: 3.5 ratio (ref 0.0–5.0)
Cholesterol, Total: 159 mg/dL (ref 100–199)
HDL: 46 mg/dL (ref >39)
LDL Calculated: 73 mg/dL (ref 0–99)
Triglycerides: 202 mg/dL — ABNORMAL HIGH (ref 0–149)
VLDL Cholesterol Cal: 40 mg/dL (ref 5–40)

## 2018-12-03 LAB — AMYLASE: Amylase: 50 U/L (ref 31–110)

## 2018-12-03 LAB — TSH: TSH: 7.33 u[IU]/mL — ABNORMAL HIGH (ref 0.450–4.500)

## 2018-12-03 LAB — MAGNESIUM: Magnesium: 1.7 mg/dL (ref 1.6–2.3)

## 2018-12-03 LAB — LACTATE DEHYDROGENASE: LDH: 284 IU/L — ABNORMAL HIGH (ref 121–224)

## 2018-12-03 NOTE — Telephone Encounter (Signed)
Melissa there is no documentation of all the times you called them and what they told you back on Tuesday.  (You told me on Tuesday when you called the pharmacy, they said they were unable to fax something and not that the fax was broken etc.)  -  Please document every conversation you had with them and what you were told.  Thank you.  - Also, I would like you Melissa to call the pharmacist directly and speak with them about this issue.  Please document what they tell you.    Please explain to the pharmacist ( not a Nutritional therapist ) that it is our policy that once we write a controlled substance script and it gets into the hands of the pharmacy, that we absolutely need written confirmation they destroyed it prior to writing another controlled substance for the patient.  Thank you for handling this tomorrow personally.

## 2018-12-03 NOTE — Telephone Encounter (Signed)
Melissa, Dr. Colman Cater CMA ask me to call :  [id number] [place], [place] [facility] [address] [phone] (Phone) [phone] (Fax)   -- Called made to confirm cancellation of this Rx:  Outpatient Medication Detail   Disp Refills Start End   methylphenidate (RITALIN) 20 MG tablet 270 tablet 0 12/02/2018 03/02/2019   Sig - Route: Take 1.5 tablets (30 mg total) by mouth 2 (two) times daily. - Oral   Sent to pharmacy as: methylphenidate (RITALIN) 20 MG tablet   Earliest Fill Date: 12/02/2018   E-Prescribing Status: Receipt confirmed by pharmacy (12/02/2018 3:29 PM EDT)      ---Tivis Ringer Order:  methylphenidate (RITALIN) 20 MG tablet [[id number]]    Pharmacy:  [id number] [place], [address] AT [place] #:  [id number]  Pharmacy Comments: --     ---[facility] [phone] ---Rx cancelled but they are unable to fax paper proof, their fax machine is broken & part needed to fix being ordered it may take a week  -- Ask if they can print cancellation document & e-mail it to [facility] but unable to do that either.  ---Forwarding information update to medical assistant as Juluis Rainier.  --glh

## 2018-12-03 NOTE — Telephone Encounter (Signed)
Please speak with our practice manager about what we do at this time since we cannot get confirmation they destroyed our controlled substance.  -This is not something that should affect patient care, and so please, this needs to be dealt with by end of workday tomorrow.-Thursday, 12/04/2018

## 2018-12-04 ENCOUNTER — Telehealth: Payer: Self-pay | Admitting: Family Medicine

## 2018-12-04 MED ORDER — METHYLPHENIDATE HCL 20 MG PO TABS: 30.0000 mg | ORAL_TABLET | Freq: Two times a day (BID) | ORAL | 0 refills | Status: DC

## 2018-12-04 NOTE — Telephone Encounter (Signed)
Patient called and said he is ok sending his Ritalin prescription to another pharmacy that will have it in stock. [facility] would [facility] [place] [facility] at [place], [place], [place] and their phone number is [phone].

## 2018-12-04 NOTE — Telephone Encounter (Signed)
I called the pharmacy on Tuesday after the patient's appointment due to the patient notifying the office that the pharmacy contact him to inform him that the Ritalin 20 mg is out of stock.  Requested that the Pharmacist Loma Sousa) fax over written confirmation that the RX has been void before we send over another RX for controlled substance to a different pharmacist. I was waiting on the fax and on Wednesday 12/03/2018 in the morning still had not received asked Baker Janus at the front desk to call and check on the fax.  Per note below from [facility] the pharmacy stated that fax machine not working and they are not able to send over the paper.  I called again this morning at the request of Dr. Raliegh Scarlet spoke to [facility]) again and verbally verified that they do not have any of the medication in stock and that the RX has been void. She will attempt to fax verification again for [place].   MPulliam, CMA/RT(R)

## 2018-12-04 NOTE — Telephone Encounter (Signed)
Yes please just send the pended prescription and since I have already seen the fax a sentence stating they disposed of the prior one that was sent, I have no problem sending it to a different pharmacy.  Please just send me that for my signature.  Thanks.

## 2018-12-04 NOTE — Telephone Encounter (Signed)
Please see other note in chart from today. MPulliam, CMA/RT(R)

## 2018-12-04 NOTE — Telephone Encounter (Signed)
Patient was notified. MPulliam, CMA/RT(R)

## 2018-12-04 NOTE — Telephone Encounter (Signed)
We have received fax from the pharmacy and Dr. Raliegh Scarlet reviewed.  Patient was contacted and would like for the RX to be sent for 30 days to [facility] [place] [facility] at [address], [place], [place] and their phone number is [phone]) [phone]. Called and verified that this pharmacy has medication in stock and they do.  I pended the RX with updated pharmacy information and sent to Dr. Raliegh Scarlet for review. MPulliam, CMA/RT(R)

## 2018-12-04 NOTE — Telephone Encounter (Signed)
Thank you Melissa.  Appreciate that.  Thank you for calling her again this morning.  Please just find out what other pharmacy he wishes to have the prescription sent to and have him verify they have it in stock.  Also, please look out for that fax verifying that they destroyed my first prescription.  Thank you very much.

## 2018-12-20 ENCOUNTER — Encounter: Payer: Self-pay | Admitting: Family Medicine

## 2018-12-24 ENCOUNTER — Other Ambulatory Visit: Payer: Self-pay | Admitting: Family Medicine

## 2018-12-24 DIAGNOSIS — F10231 Alcohol dependence with withdrawal delirium: Secondary | ICD-10-CM

## 2018-12-24 DIAGNOSIS — K7030 Alcoholic cirrhosis of liver without ascites: Secondary | ICD-10-CM

## 2018-12-24 DIAGNOSIS — F10931 Alcohol use, unspecified with withdrawal delirium: Secondary | ICD-10-CM

## 2018-12-24 DIAGNOSIS — F5101 Primary insomnia: Secondary | ICD-10-CM

## 2018-12-28 ENCOUNTER — Encounter: Payer: Self-pay | Admitting: Family Medicine

## 2018-12-28 ENCOUNTER — Other Ambulatory Visit: Payer: Self-pay | Admitting: Family Medicine

## 2018-12-28 DIAGNOSIS — F10931 Alcohol use, unspecified with withdrawal delirium: Secondary | ICD-10-CM

## 2018-12-28 DIAGNOSIS — F10231 Alcohol dependence with withdrawal delirium: Secondary | ICD-10-CM

## 2018-12-30 ENCOUNTER — Other Ambulatory Visit: Payer: Self-pay | Admitting: Family Medicine

## 2018-12-30 DIAGNOSIS — F902 Attention-deficit hyperactivity disorder, combined type: Secondary | ICD-10-CM

## 2018-12-30 NOTE — Telephone Encounter (Signed)
Per Valetta Fuller, Dr. Raliegh Scarlet to address refill request.  Charyl Bigger, CMA

## 2018-12-30 NOTE — Telephone Encounter (Signed)
Patient called states Rx refill pending for OV/ Telehealth w/ provider-- scheduled appt for 9/30 @  3:00-- but will run out of Rx in 5 days. :  methylphenidate (RITALIN) 20 MG tablet [[id number]]   Order Details Dose: 30 mg Route: Oral Frequency: 2 times daily  Dispense Quantity: 90 tablet Refills: 0 Fills remaining: --        Sig: Take 1.5 tablets (30 mg total) by mouth 2 (two) times daily.      -Forwarding request to medical assistant that if approved send refill order to :  [id number] [place], [place] - [address] [phone] (Phone) [phone] (Fax)   --Dion Body

## 2018-12-30 NOTE — Telephone Encounter (Signed)
MyChart message sent to pt that Dr. Raliegh Scarlet will address refill request upon her return to office on 12/31/2018.  Charyl Bigger, CMA

## 2018-12-31 MED ORDER — METHYLPHENIDATE HCL 20 MG PO TABS: 30.0000 mg | ORAL_TABLET | Freq: Two times a day (BID) | ORAL | 0 refills | Status: AC

## 2019-01-21 ENCOUNTER — Other Ambulatory Visit: Payer: Self-pay

## 2019-01-21 ENCOUNTER — Encounter: Payer: Self-pay | Admitting: Family Medicine

## 2019-01-21 ENCOUNTER — Ambulatory Visit (INDEPENDENT_AMBULATORY_CARE_PROVIDER_SITE_OTHER): Payer: BC Managed Care – PPO | Admitting: Family Medicine

## 2019-01-21 DIAGNOSIS — K219 Gastro-esophageal reflux disease without esophagitis: Secondary | ICD-10-CM

## 2019-01-21 DIAGNOSIS — Z8669 Personal history of other diseases of the nervous system and sense organs: Secondary | ICD-10-CM

## 2019-01-21 DIAGNOSIS — K7030 Alcoholic cirrhosis of liver without ascites: Secondary | ICD-10-CM | POA: Diagnosis not present

## 2019-01-21 DIAGNOSIS — F10231 Alcohol dependence with withdrawal delirium: Secondary | ICD-10-CM

## 2019-01-21 DIAGNOSIS — K7011 Alcoholic hepatitis with ascites: Secondary | ICD-10-CM

## 2019-01-21 DIAGNOSIS — K3184 Gastroparesis: Secondary | ICD-10-CM | POA: Diagnosis not present

## 2019-01-21 DIAGNOSIS — F5101 Primary insomnia: Secondary | ICD-10-CM

## 2019-01-21 DIAGNOSIS — E782 Mixed hyperlipidemia: Secondary | ICD-10-CM

## 2019-01-21 DIAGNOSIS — R7989 Other specified abnormal findings of blood chemistry: Secondary | ICD-10-CM

## 2019-01-21 DIAGNOSIS — F10931 Alcohol use, unspecified with withdrawal delirium: Secondary | ICD-10-CM

## 2019-01-21 MED ORDER — FLUOXETINE HCL 20 MG PO TABS: ORAL_TABLET | ORAL | 1 refills | Status: AC

## 2019-01-21 NOTE — Progress Notes (Signed)
Telehealth office visit note for Vincent [name], D.O- at Primary Care at [facility]   I connected with current patient today and verified that I am speaking with the correct person using two identifiers.   . Location of the patient: Home . Location of the provider: Office Only the patient (+/- their family members at pt's discretion) and myself were participating in the encounter    - This visit type was conducted due to national recommendations for restrictions regarding the COVID-19 Pandemic (e.g. social distancing) in an effort to limit this patient's exposure and mitigate transmission in our community.  This format is felt to be most appropriate for this patient at this time.   - The patient did not have access to video technology or had technical difficulties with video requiring transitioning to audio format only. - No physical exam could be performed with this format, beyond that communicated to [place] by the patient/ family members as noted.   - Additionally my office staff/ schedulers discussed with the patient that there may be a monetary charge related to this service, depending on their medical insurance.   The patient expressed understanding, and agreed to proceed.       History of Present Illness:  Says he's "doing better, I think."  - Sobriety from Alcohol Says his sobriety is going a lot better; "I'll be honest, once or twice I had a couple, but nothing like before and it's been a couple weeks."    -  Says "that's far better than how I was doing."  Says he had a couple when he had a bad day.  -  Says "I don't want to do that, but it wasn't ridiculous; it was just 2 or 3.  Not great given my history, but not terrible I guess."  -  Says "I feel like I'm in a better spot than I was."    The last time he had any alcohol was two weeks ago.  States he didn't have any withdrawal symptoms; "I only had a couple, and it was spread out; it had been a long time since before."   Regarding establishing with therapy or psychiatry, states "the thought of speaking with those people about it makes me want to drink right now."  Says "I'm not a very emotional or open type person; I get that from my dad."   - Mood Management Says he doesn't think the Buspar really helped much.  Notes "I don't want anything that's going to make me gain weight but it would be nice to have a chronic thing."  His main concern is weight gain.   - ADHD Management Continues on ritalin 1.5 tabs twice daily.  Says "there's times it feels like too little, definitely not too much."  States "it works and I don't know where the break point is on max dose...but I don't want to go overboard either."   - Blood Pressure/ tachycardia due to ETOH w/d  Has not taken the blood pressure medications for shakes/ tachy etc.   - Sleep Sometimes takes hydroxyzine to help him sleep.   Notes "an antihistamine is a pretty benign placebo effect."   States he occasionally takes melatonin, but not regularly.  Has not taken his Zofran in a while.   - Heartburn Takes Prilosec occasionally.   Chart notes on labs drawn 12/02/2018: "Notes recorded by Vincent Dance, DO on 12/03/2018 at 6:49 PM EDT  Because of the extreme numbers, please call  patient to discuss these results with him so we can ensure this is communicated directly to the patient and have documentation of this. Thanks.   Please let patient know that his recent alcohol ingestion and regular use has caused his liver enzymes to once again, get to levels similar to what sent him to the hospital last April. This is very concerning how quickly his liver is inflamed by his alcohol use and, he needs to 100% abstain from alcohol or this could cause serious consequences with his morbidity and mortality in the future.... His liver enzymes should be rechecked in 1 month after he is completely abstained from alcohol.  ---> Please tell him I recommend he make this  appointment to get blood work and then in the least have a follow-up doxy visit with me. If you would like to get the blood drawn down in [place], please find out where he like it drawn and we can send orders there as long as we have the results and time for his doxy visit follow-up.  -Order should include an LDH, CMP, TSH, T3, free T4.   Please let me know if patient has any further questions or concerns  -Thanks, Dr. Val Eagle"   [facility]  Decreased Interest 1  Down, Depressed, Hopeless 1  PHQ - 2 Score 2  Altered sleeping 2  Tired, decreased energy 2  Change in appetite 1  Feeling bad or failure about yourself  1  Trouble concentrating 0  Moving slowly or fidgety/restless 0  Suicidal thoughts 0  PHQ-9 Score 8  Difficult doing work/chores Somewhat difficult  Some encounter information is confidential and restricted. Go to Review Flowsheets activity to see all data.     Impression and Recommendations:    1. Alcoholic cirrhosis, unspecified whether ascites present (HCC)   2. significant concern for Delirium tremens (HCC)-  now resolved   3. h/o Alcoholic hepatitis with ascites   4. Gastroparesis   5. Moderate mixed hyperlipidemia not requiring statin therapy   6. Elevated TSH   7. H/O partial seizures from ETOH w/d and possibly Wellbutrin lowering sz threshold   8. Primary insomnia   9. Gastroesophageal reflux disease, unspecified whether esophagitis present      Sobriety from Alcohol Use - Patient with difficulty sticking with total sobriety.  - Advised patient to recognize that he is still struggling with his sobriety and that he can and should always reach out for support, counseling, and assistance.  - Encouraged patient again, to get established with psychology and psychiatry where he lives and speak with a professional counselor openly about his concerns.  Consider AA meeting etc.  Patient strongly declines.   Tells me he doesn't rust anyone with  his medical problems and that's why he will continue to come up here and see me in [place] even with him in [place].   - Discussion held and extensive education provided. - Strongly advised patient to be open and face his demons head-on.  - Encouraged healthy emotional habits with patient during appointment today.  Stress mgt techniques  - Discussed that unless patient is willing to be open and honest with himself, he will not be able to commit to sobriety and develop less destructive external coping mechanisms.  - Will continue to monitor closely.  Pt congratulated on being open and honest with me.  Pt tells me his wife does not even know these things.   Per pt: No one does except me.  But cautioned pt against holding this all in.   Mood Management - Discussed prescription mood management with patient today. - Advised use of new medication (Fluoxetine) with patient today.  See med list.   - Risks and benefits discussed and extensive education provided. -Discussed less than 3% chance for risk of seizures with this medicine which is dose dependent with activity being more in the 60 mg plus range.  We discussed this risk extensively and patient still desires treatment.  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   He feels the benefits of the medicines will outweigh the minimal risk  - Reviewed starting low dose medication today and tapering to full dose- I would rec NOT beyond 20mg . - Return in one month for follow-up after starting mood medication.  - In addition to prescription mood management, reviewed the "spokes of the wheel" of mood and health management.   --->  Stressed the importance of ongoing prudent habits, including regular exercise, appropriate sleep hygiene, healthful dietary habits, and prayer/meditation to relax.  - STRONGLY encouraged patient to seek therapy/counseling despite patient's protests. - Discussed that psychiatry can  evaluate the patient for other facets and mood disorders that might be playing a role in patient's behavior. - Patient declines referral to behavioral health today.  - Will continue to monitor.  Lifestyle & Preventative Health Maintenance - Advised patient to continue working toward exercising to improve overall mental, physical, and emotional health.    - Encouraged patient to engage in daily physical activity, especially a formal exercise routine.  Recommended that the patient eventually strive for at least 150 minutes of moderate cardiovascular activity per week according to guidelines established by the [facility].   - Healthy dietary habits encouraged, including low-carb, and high amounts of lean protein in diet.   - Patient should also consume adequate amounts of water.  - Health counseling performed.  All questions answered.   Recommendations - Return in one month after starting fluoxetine. - NEEDS bldwrk- recheck of liver enzymes and all.  TOLD PT to call [facility] with places of local draw stations that we can send orders to. - Patient knows to call in sooner PRN for acute concerns.   - As part of my medical decision making, I reviewed the following data within the electronic MEDICAL RECORD NUMBER History obtained from pt /family, CMA notes reviewed and incorporated if applicable, Labs reviewed, Radiograph/ tests reviewed if applicable and OV notes from prior OV's with me, as well as other specialists she/he has seen since seeing me last, were all reviewed and used in my medical decision making process today.   - Additionally, discussion had with patient regarding txmnt plan, and their biases/concerns about that plan were used in my medical decision making today.   - The patient agreed with the plan and demonstrated an understanding of the instructions.   No barriers to understanding were identified.   - Red flag symptoms and signs discussed in detail.  Patient expressed understanding regarding what  to do in case of emergency\ urgent symptoms.  The patient was advised to call back or seek an in-person evaluation if the symptoms worsen or if the condition fails to improve as anticipated.   Return for 4-6wks - started new prozac, cont same dose ritalin.    Message was sent to my staff to make sure they follow-up with the patient so that blood work can be obtained in [place] somewhere and results sent  to [place] 1 week prior to his upcoming appointment   Meds ordered this encounter  Medications  . FLUoxetine (PROZAC) 20 MG tablet    Sig: Take 0.5 tablets (10 mg total) by mouth daily for 10 days, THEN 1 tablet (20 mg total) daily.    Dispense:  90 tablet    Refill:  1    Medications Discontinued During This Encounter  Medication Reason  . propranolol (INDERAL) 10 MG tablet Non-compliance  . 0.9 %  sodium chloride infusion       I provided 21+ minutes of non face-to-face time during this encounter.  Additional time was spent with charting and coordination of care after the actual visit commenced.   Note:  This note was prepared with assistance of Dragon voice recognition software. Occasional wrong-word or sound-a-like substitutions may have occurred due to the inherent limitations of voice recognition software.  This document serves as a record of services personally performed by [name], DO. It was created on her behalf by Peggye Fothergill, a trained medical scribe. The creation of this record is based on the scribe's personal observations and the provider's statements to them.   I have reviewed the above medical documentation for accuracy and completeness and I concur.  [name], DO 01/22/2019 12:54 PM      Patient Care Team    Relationship Specialty Notifications Start End  [name], DO PCP - General Family Medicine  08/06/17     -Vitals obtained; medications/ allergies reconciled;  personal medical, social, Sx etc.histories were updated by CMA,  reviewed by me and are reflected in chart   Patient Active Problem List   Diagnosis Date Noted  . significant concern for Delirium tremens (HCC)-  now resolved 01/25/2019  . Gastroparesis 12/03/2017  . Hyperlipidemia 12/03/2017  . Elevated TSH 12/03/2017  . Vitamin D deficiency 12/03/2017  . Hyperglycemia 12/03/2017  . Allergic contact dermatitis 12/03/2017  . H/O partial seizures 09/24/2017  . Alcoholic cirrhosis (HCC) 08/20/2017  . Insomnia 08/20/2017  . Gastroesophageal reflux disease 08/20/2017  . Ascites   . Liver failure without hepatic coma (HCC)   . Hypokalemia   . Anxiety   . h/o Alcoholic hepatitis with ascites   . Coagulopathy (HCC) 08/08/2017  . Hyponatremia 08/07/2017  . ADHD (attention deficit hyperactivity disorder) 08/06/2017     Current Meds  Medication Sig  . FLUoxetine (PROZAC) 20 MG tablet Take 0.5 tablets (10 mg total) by mouth daily for 10 days, THEN 1 tablet (20 mg total) daily.  . hydrOXYzine (ATARAX/VISTARIL) 50 MG tablet One half to one tablet by mouth at bedtime as needed for sleep/itching  . Melatonin 5 MG TABS Take 5 mg by mouth at bedtime as needed (for sleep).  . methylphenidate (RITALIN) 20 MG tablet Take 1.5 tablets (30 mg total) by mouth 2 (two) times daily.  . Multiple Vitamin (MULTIVITAMIN WITH MINERALS) TABS tablet Take 1 tablet by mouth daily.  Marland Kitchen omeprazole (PRILOSEC) 20 MG capsule bid with meals for 2 wks, then once daily  . ondansetron (ZOFRAN-ODT) 8 MG disintegrating tablet Take 1 tablet (8 mg total) by mouth every 8 (eight) hours as needed for nausea.     Allergies:  Allergies  Allergen Reactions  . Amoxicillin Rash     ROS:  See above HPI for pertinent positives and negatives   Objective:   There were no vitals taken for this visit.  (if some vitals are omitted, this means that patient was UNABLE to obtain them even though  they were asked to get them prior to OV today.  They were asked to call us at their earliest  convenience with these once obtained. )  General: A & O * 3; sounds in no acute distress; in usual state of health.  Skin: Pt confirms warm and dry extremities and pink fingertips HEENT: Pt confirms lips non-cyanotic Chest: Patient confirms normal chest excursion and movement Respiratory: speaking in full sentences, no conversational dyspnea; patient confirms no use of accessory muscles Psych: insight appears good, mood- appears full

## 2019-01-25 ENCOUNTER — Telehealth: Payer: Self-pay | Admitting: Family Medicine

## 2019-01-25 DIAGNOSIS — F10231 Alcohol dependence with withdrawal delirium: Secondary | ICD-10-CM | POA: Insufficient documentation

## 2019-01-25 DIAGNOSIS — K3184 Gastroparesis: Secondary | ICD-10-CM

## 2019-01-25 DIAGNOSIS — K7030 Alcoholic cirrhosis of liver without ascites: Secondary | ICD-10-CM

## 2019-01-25 DIAGNOSIS — R7989 Other specified abnormal findings of blood chemistry: Secondary | ICD-10-CM

## 2019-01-25 DIAGNOSIS — E871 Hypo-osmolality and hyponatremia: Secondary | ICD-10-CM

## 2019-01-25 DIAGNOSIS — E876 Hypokalemia: Secondary | ICD-10-CM

## 2019-01-25 DIAGNOSIS — K7210 Chronic hepatic failure without coma: Secondary | ICD-10-CM

## 2019-01-25 DIAGNOSIS — E559 Vitamin D deficiency, unspecified: Secondary | ICD-10-CM

## 2019-01-25 DIAGNOSIS — F10931 Alcohol use, unspecified with withdrawal delirium: Secondary | ICD-10-CM | POA: Insufficient documentation

## 2019-01-25 NOTE — Telephone Encounter (Signed)
Call patient and tell him he needs to call us with the place and fax number of the local draw station and have blood work prior to his 1 month or so follow-up with me. -Tell him this blood should be drawn at least 5 days prior to his follow-up doxy visit with me after starting the fluoxetine.  -Please have 1 of the CMA's place orders for blood work to be drawn once you know where to send the orders to.   -Order should include an LDH, CMP, TSH, T3, free T4.    Diagnosis codes: Alcoholic cirrhosis, unspecified whether ascites present ([place])  Chronic liver failure without hepatic coma (HCC)  Gastroparesis  Elevated TSH  Vitamin D deficiency  h/o Hypokalemia  h/o Hyponatremia

## 2019-02-02 ENCOUNTER — Other Ambulatory Visit: Payer: Self-pay

## 2019-02-02 DIAGNOSIS — K3184 Gastroparesis: Secondary | ICD-10-CM

## 2019-02-02 DIAGNOSIS — R7989 Other specified abnormal findings of blood chemistry: Secondary | ICD-10-CM

## 2019-02-02 DIAGNOSIS — E871 Hypo-osmolality and hyponatremia: Secondary | ICD-10-CM

## 2019-02-02 DIAGNOSIS — K7030 Alcoholic cirrhosis of liver without ascites: Secondary | ICD-10-CM

## 2019-02-02 DIAGNOSIS — E876 Hypokalemia: Secondary | ICD-10-CM

## 2019-02-02 DIAGNOSIS — E559 Vitamin D deficiency, unspecified: Secondary | ICD-10-CM

## 2019-02-02 DIAGNOSIS — K7210 Chronic hepatic failure without coma: Secondary | ICD-10-CM

## 2019-02-02 NOTE — Telephone Encounter (Signed)
MyChart message sent to pt requesting information.  Charyl Bigger, CMA

## 2019-02-06 NOTE — Addendum Note (Signed)
Addended by: Fonnie Mu on: 02/06/2019 02:24 PM   Modules accepted: Orders

## 2019-02-21 ENCOUNTER — Encounter: Payer: Self-pay | Admitting: Family Medicine

## 2019-02-22 ENCOUNTER — Other Ambulatory Visit: Payer: Self-pay | Admitting: Family Medicine

## 2019-02-22 ENCOUNTER — Encounter: Payer: Self-pay | Admitting: Family Medicine

## 2019-02-22 DIAGNOSIS — R112 Nausea with vomiting, unspecified: Secondary | ICD-10-CM

## 2019-02-22 DIAGNOSIS — K3184 Gastroparesis: Secondary | ICD-10-CM

## 2019-02-22 DIAGNOSIS — K7030 Alcoholic cirrhosis of liver without ascites: Secondary | ICD-10-CM

## 2019-02-22 MED ORDER — ONDANSETRON 8 MG PO TBDP: 8.0000 mg | ORAL_TABLET | Freq: Three times a day (TID) | ORAL | 0 refills | Status: AC | PRN

## 2019-02-22 NOTE — Progress Notes (Unsigned)
Will send in controlled substance once I get in to clinic tomorrow.

## 2019-02-23 ENCOUNTER — Other Ambulatory Visit: Payer: Self-pay | Admitting: Family Medicine

## 2019-02-23 ENCOUNTER — Telehealth: Payer: Self-pay | Admitting: Family Medicine

## 2019-02-23 DIAGNOSIS — K7011 Alcoholic hepatitis with ascites: Secondary | ICD-10-CM

## 2019-02-23 DIAGNOSIS — K7210 Chronic hepatic failure without coma: Secondary | ICD-10-CM

## 2019-02-23 DIAGNOSIS — R7989 Other specified abnormal findings of blood chemistry: Secondary | ICD-10-CM | POA: Diagnosis not present

## 2019-02-23 DIAGNOSIS — E871 Hypo-osmolality and hyponatremia: Secondary | ICD-10-CM

## 2019-02-23 DIAGNOSIS — K7030 Alcoholic cirrhosis of liver without ascites: Secondary | ICD-10-CM

## 2019-02-23 MED ORDER — LORAZEPAM 1 MG PO TABS: ORAL_TABLET | ORAL | 0 refills | Status: AC

## 2019-02-23 NOTE — Telephone Encounter (Signed)
Per patient he went to [place] and had labs drawn just now and they will be sent to [place]. AS, CMA

## 2019-02-23 NOTE — Progress Notes (Signed)
Recently discussed with patient how he needs to go for blood work.  He told me he was going to call with information about a local draw station.    Order should include an LDH, CMP, TSH, T3, free T4.

## 2019-02-23 NOTE — Telephone Encounter (Signed)
Thanks Dorothea Ogle for letting me know who I need to contact.  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

## 2019-02-23 NOTE — Progress Notes (Signed)
Lorazepam that was pended is now sent in this morning.  She was notified

## 2019-02-23 NOTE — Telephone Encounter (Signed)
Per Dr. Hershal Coria request I contacted [facility]. Explained the situation to one of the therapist who is at their call center as they are currently doing all outpatient [facility] visits virtually. She explained that [place] appts are free to providers and that he would just need to reach out to their office call center to begin the process (that number is [phone]), she also gave me the crisis line if needed for him on short notice needs (that number is [phone]).  I also told stated that PCP would like the possibility of talking to one of their psychiatrist and was given their direct office number of [phone]. I will be reaching out to them for Dr. Raliegh Scarlet to see what the best way of getting the providers together to discuss this case.

## 2019-02-24 ENCOUNTER — Telehealth: Payer: Self-pay | Admitting: Family Medicine

## 2019-02-24 LAB — TSH: TSH: 7.06 u[IU]/mL — ABNORMAL HIGH (ref 0.450–4.500)

## 2019-02-24 NOTE — Telephone Encounter (Signed)
Patient called this weekend on Saturday evening around 6:30 PM intoxicated and stated he needed help as he wanted to quit drinking.  Therefore I subsequently prescribed lorazepam to help with DT symptoms that he only use after work, as well as Zofran to help with the nausea. At that time I recommended he go to a  inpatient intensive alcohol rehab which patient declined  -Called yesterday evening by wife stating that patient was incoherent and in acute distress around 5:36 PM. Advised them to proceed to the closest ER. At the time his ethanol level was 280+ however, interestingly enough his urine benzodiazepine was negative. Per the wife, 13 tablets were missing from the lorazepam bottle that I had given him that afternoon, of 30 count tabs.  -He was subsequently seen at [facility] ED in [place]. They gave him the name of a social worker to call " Social worker- [phone] Clide Deutscher after 2p, please call if you are having trouble getting a follow-up appointment in also request other resources for alcohol withdrawal and cessation. " So he can participate in a rehab program  -I advised him as well as his wife not to take any more lorazepam and he needed to notify his work immediately of his medical problem and situation due to impairment-I told both of them I do not recommend he see patients or participate in any patient care activities at all at this time until seen and evaluated by a psychiatrist and cleared by them.    The wife as well as patient told me they would be contacting the HR department at his urologic private practice in [place] today 03/26/2019 and notifying them of the above.  -Since patient took my controlled substance not as directed, he understands he will not get any more controlled substances from this practice even including his ADHD medicines etc. I told patient per a previous message as well that he will be obtaining all future treatment per his psychiatrist in [place].

## 2019-02-28 DIAGNOSIS — K760 Fatty (change of) liver, not elsewhere classified: Secondary | ICD-10-CM | POA: Diagnosis not present

## 2019-02-28 DIAGNOSIS — K7689 Other specified diseases of liver: Secondary | ICD-10-CM | POA: Diagnosis not present

## 2019-02-28 DIAGNOSIS — K7460 Unspecified cirrhosis of liver: Secondary | ICD-10-CM | POA: Diagnosis not present

## 2019-03-02 ENCOUNTER — Encounter: Payer: Self-pay | Admitting: Family Medicine

## 2019-03-06 LAB — COMPREHENSIVE METABOLIC PANEL
ALT: 74 IU/L — ABNORMAL HIGH (ref 0–44)
AST: 228 IU/L — ABNORMAL HIGH (ref 0–40)
Albumin/Globulin Ratio: 1.9 (ref 1.2–2.2)
Albumin: 4.5 g/dL (ref 4.0–5.0)
Alkaline Phosphatase: 122 IU/L — ABNORMAL HIGH (ref 39–117)
BUN/Creatinine Ratio: 7 — ABNORMAL LOW (ref 9–20)
BUN: 10 mg/dL (ref 6–20)
Bilirubin Total: 0.7 mg/dL (ref 0.0–1.2)
CO2: 20 mmol/L (ref 20–29)
Calcium: 9.3 mg/dL (ref 8.7–10.2)
Chloride: 95 mmol/L — ABNORMAL LOW (ref 96–106)
Creatinine, Ser: 1.5 mg/dL — ABNORMAL HIGH (ref 0.76–1.27)
GFR calc Af Amer: 69 mL/min/{1.73_m2} (ref >59)
GFR calc non Af Amer: 59 mL/min/{1.73_m2} — ABNORMAL LOW (ref >59)
Globulin, Total: 2.4 g/dL (ref 1.5–4.5)
Glucose: 142 mg/dL — ABNORMAL HIGH (ref 65–99)
Potassium: 3.9 mmol/L (ref 3.5–5.2)
Sodium: 140 mmol/L (ref 134–144)
Total Protein: 6.9 g/dL (ref 6.0–8.5)

## 2019-03-06 LAB — SPECIMEN STATUS REPORT

## 2019-03-06 LAB — LACTATE DEHYDROGENASE: LDH: 302 IU/L — ABNORMAL HIGH (ref 121–224)

## 2019-03-06 LAB — T3: T3, Total: 71 ng/dL (ref 71–180)

## 2019-03-06 LAB — T4, FREE: Free T4: 0.99 ng/dL (ref 0.82–1.77)

## 2019-03-22 DIAGNOSIS — Z20828 Contact with and (suspected) exposure to other viral communicable diseases: Secondary | ICD-10-CM | POA: Diagnosis not present

## 2019-03-22 DIAGNOSIS — R509 Fever, unspecified: Secondary | ICD-10-CM | POA: Diagnosis not present

## 2019-04-24 ENCOUNTER — Other Ambulatory Visit: Payer: Self-pay | Admitting: Family Medicine

## 2019-04-24 DIAGNOSIS — R112 Nausea with vomiting, unspecified: Secondary | ICD-10-CM

## 2019-04-24 DIAGNOSIS — K3184 Gastroparesis: Secondary | ICD-10-CM

## 2019-04-24 DIAGNOSIS — K219 Gastro-esophageal reflux disease without esophagitis: Secondary | ICD-10-CM

## 2019-04-27 NOTE — Telephone Encounter (Signed)
The most recent time I looked in this patient's chart, he had established with a PCP down in [place] and no longer identified me as his primary care provider.    We will have to reach out to the patient and see if this is the case or not

## 2019-04-28 NOTE — Telephone Encounter (Signed)
Left message for patient to call back regarding below. AS, CMA 

## 2019-08-26 ENCOUNTER — Other Ambulatory Visit: Payer: Self-pay | Admitting: Family Medicine

## 2019-08-26 DIAGNOSIS — R112 Nausea with vomiting, unspecified: Secondary | ICD-10-CM

## 2019-08-26 DIAGNOSIS — K3184 Gastroparesis: Secondary | ICD-10-CM

## 2019-08-26 DIAGNOSIS — K219 Gastro-esophageal reflux disease without esophagitis: Secondary | ICD-10-CM

## 2019-09-09 ENCOUNTER — Other Ambulatory Visit: Payer: Self-pay | Admitting: Physician Assistant

## 2019-09-09 DIAGNOSIS — K3184 Gastroparesis: Secondary | ICD-10-CM

## 2019-09-09 DIAGNOSIS — R112 Nausea with vomiting, unspecified: Secondary | ICD-10-CM

## 2019-09-09 DIAGNOSIS — K219 Gastro-esophageal reflux disease without esophagitis: Secondary | ICD-10-CM
